# Patient Record
Sex: Female | Born: 1985 | Hispanic: No | Marital: Married | Smoking: Never smoker
Health system: Southern US, Community
[De-identification: ages and names within clinical notes are randomized; demographics above are authoritative.]

## PROBLEM LIST (undated history)

## (undated) DIAGNOSIS — Z8759 Personal history of other complications of pregnancy, childbirth and the puerperium: Secondary | ICD-10-CM

## (undated) HISTORY — PX: NO PAST SURGERIES: SHX2092

---

## 2010-08-09 NOTE — L&D Delivery Note (Signed)
Delivery Note  At 6:20 AM a non-viable female (intrauterine fetal demise) was delivered via Vaginal, Spontaneous Delivery (Presentation: delivered with nursing). APGAR: n/a; weight 1 lb 12 oz (794 g).  Placenta status: Intact, Spontaneous, no obvious abnormalities, to pathology. Cord: 3 vessels with the following complications: None.   Anesthesia: None  Episiotomy: None  Lacerations: None  Est. Blood Loss (mL): <500   Mom to women's unit. Fetus to autopsy.   Supervised by Greig Right, CNM   [name] [name]  08/06/2011, 6:40 AM

## 2010-08-09 NOTE — L&D Delivery Note (Deleted)
Delivery Note At 6:20 AM a non-viable female was delivered via Vaginal, Spontaneous Delivery (Presentation: delivered with nursing).  APGAR: n/a; weight 1 lb 12 oz (794 g).   Placenta status: Intact, Spontaneous.  Cord: 3 vessels with the following complications: None.   Anesthesia: None  Episiotomy: None Lacerations: None Est. Blood Loss (mL): <500  Mom to women's unit.  Baby to nursery-stable.  Supervised by Greig Right, CNM  HUNTER, STEPHEN 08/06/2011, 6:40 AM

## 2010-09-28 ENCOUNTER — Other Ambulatory Visit: Payer: Self-pay | Admitting: Family Medicine

## 2010-09-28 ENCOUNTER — Other Ambulatory Visit: Payer: Self-pay | Admitting: Obstetrics & Gynecology

## 2010-09-28 DIAGNOSIS — Z3689 Encounter for other specified antenatal screening: Secondary | ICD-10-CM

## 2010-09-28 DIAGNOSIS — Z3687 Encounter for antenatal screening for uncertain dates: Secondary | ICD-10-CM

## 2010-09-28 LAB — HEMOGLOBIN EVAL RFX ELECTROPHORESIS: Hemoglobin Evaluation: NORMAL

## 2010-10-02 ENCOUNTER — Ambulatory Visit (HOSPITAL_COMMUNITY)
Admission: RE | Admit: 2010-10-02 | Discharge: 2010-10-02 | Disposition: A | Discharge status: Home / Self Care | Payer: Medicaid Other | Source: Ambulatory Visit | Attending: Family Medicine | Admitting: Family Medicine

## 2010-10-02 DIAGNOSIS — Z363 Encounter for antenatal screening for malformations: Secondary | ICD-10-CM | POA: Insufficient documentation

## 2010-10-02 DIAGNOSIS — Z3689 Encounter for other specified antenatal screening: Secondary | ICD-10-CM

## 2010-10-02 DIAGNOSIS — Z3687 Encounter for antenatal screening for uncertain dates: Secondary | ICD-10-CM

## 2010-10-02 DIAGNOSIS — O358XX0 Maternal care for other (suspected) fetal abnormality and damage, not applicable or unspecified: Secondary | ICD-10-CM | POA: Insufficient documentation

## 2010-10-02 DIAGNOSIS — Z1389 Encounter for screening for other disorder: Secondary | ICD-10-CM | POA: Insufficient documentation

## 2010-10-23 ENCOUNTER — Other Ambulatory Visit: Payer: Self-pay | Admitting: Family Medicine

## 2010-10-23 DIAGNOSIS — N1330 Unspecified hydronephrosis: Secondary | ICD-10-CM

## 2010-11-01 ENCOUNTER — Inpatient Hospital Stay (HOSPITAL_COMMUNITY): Payer: Medicaid Other

## 2010-11-01 ENCOUNTER — Emergency Department (HOSPITAL_COMMUNITY)
Admission: EM | Admit: 2010-11-01 | Discharge: 2010-11-01 | Disposition: A | Discharge status: Other Acute Inpatient Hospital | Payer: Medicaid Other | Source: Home / Self Care | Attending: Emergency Medicine | Admitting: Emergency Medicine

## 2010-11-01 ENCOUNTER — Inpatient Hospital Stay (HOSPITAL_COMMUNITY)
Admission: AD | Admit: 2010-11-01 | Discharge: 2010-11-03 | DRG: 778 | Disposition: A | Discharge status: Home / Self Care | Payer: Medicaid Other | Source: Ambulatory Visit | Attending: Obstetrics & Gynecology | Admitting: Obstetrics & Gynecology

## 2010-11-01 DIAGNOSIS — O4700 False labor before 37 completed weeks of gestation, unspecified trimester: Principal | ICD-10-CM | POA: Diagnosis present

## 2010-11-01 DIAGNOSIS — O4690 Antepartum hemorrhage, unspecified, unspecified trimester: Secondary | ICD-10-CM | POA: Diagnosis present

## 2010-11-01 LAB — DIFFERENTIAL
Basophils Relative: 0 % (ref 0–1)
Eosinophils Absolute: 0.2 10*3/uL (ref 0.0–0.7)
Lymphs Abs: 1.2 10*3/uL (ref 0.7–4.0)
Monocytes Relative: 10 % (ref 3–12)
Neutro Abs: 6.5 10*3/uL (ref 1.7–7.7)
Neutrophils Relative %: 74 % (ref 43–77)

## 2010-11-01 LAB — URINE MICROSCOPIC-ADD ON

## 2010-11-01 LAB — URINALYSIS, ROUTINE W REFLEX MICROSCOPIC
Protein, ur: NEGATIVE mg/dL
Urobilinogen, UA: 0.2 mg/dL (ref 0.0–1.0)

## 2010-11-01 LAB — BASIC METABOLIC PANEL
CO2: 20 mEq/L (ref 19–32)
Chloride: 105 mEq/L (ref 96–112)
GFR calc Af Amer: >60 mL/min (ref >60)
Potassium: 3.4 mEq/L — ABNORMAL LOW (ref 3.5–5.1)

## 2010-11-01 LAB — RAPID URINE DRUG SCREEN, HOSP PERFORMED
Amphetamines: NOT DETECTED
Cocaine: NOT DETECTED
Opiates: NOT DETECTED
Tetrahydrocannabinol: NOT DETECTED

## 2010-11-01 LAB — CBC
Hemoglobin: 10.8 g/dL — ABNORMAL LOW (ref 12.0–15.0)
Platelets: 222 10*3/uL (ref 150–400)
RBC: 4.28 MIL/uL (ref 3.87–5.11)
WBC: 8.8 10*3/uL (ref 4.0–10.5)

## 2010-11-01 LAB — TYPE AND SCREEN: Antibody Screen: NEGATIVE

## 2010-11-01 LAB — ABO/RH: ABO/RH(D): O POS

## 2010-11-02 LAB — KLEIHAUER-BETKE STAIN: Quantitation Fetal Hemoglobin: 0 mL

## 2010-11-03 LAB — STREP B DNA PROBE: Strep Group B Ag: POSITIVE

## 2010-11-05 ENCOUNTER — Other Ambulatory Visit: Payer: Self-pay | Admitting: Family Medicine

## 2010-11-05 ENCOUNTER — Inpatient Hospital Stay (HOSPITAL_COMMUNITY)
Admission: AD | Admit: 2010-11-05 | Discharge: 2010-11-07 | DRG: 774 | Disposition: A | Discharge status: Home Health | Payer: Medicaid Other | Source: Ambulatory Visit | Attending: Obstetrics & Gynecology | Admitting: Obstetrics & Gynecology

## 2010-11-05 DIAGNOSIS — O99892 Other specified diseases and conditions complicating childbirth: Secondary | ICD-10-CM

## 2010-11-05 DIAGNOSIS — Z22330 Carrier of Group B streptococcus: Secondary | ICD-10-CM

## 2010-11-05 DIAGNOSIS — Z331 Pregnant state, incidental: Secondary | ICD-10-CM

## 2010-11-05 DIAGNOSIS — O9989 Other specified diseases and conditions complicating pregnancy, childbirth and the puerperium: Secondary | ICD-10-CM

## 2010-11-05 DIAGNOSIS — O26879 Cervical shortening, unspecified trimester: Secondary | ICD-10-CM

## 2010-11-05 LAB — CBC
HCT: 36.1 % (ref 36.0–46.0)
MCH: 25.3 pg — ABNORMAL LOW (ref 26.0–34.0)
MCHC: 31.9 g/dL (ref 30.0–36.0)
MCV: 79.3 fL (ref 78.0–100.0)
Platelets: 200 10*3/uL (ref 150–400)
RDW: 16.3 % — ABNORMAL HIGH (ref 11.5–15.5)

## 2010-11-05 LAB — POCT URINALYSIS DIP (DEVICE)
Ketones, ur: NEGATIVE mg/dL
Protein, ur: NEGATIVE mg/dL
Specific Gravity, Urine: 1.01 (ref 1.005–1.030)
Urobilinogen, UA: 0.2 mg/dL (ref 0.0–1.0)
pH: 6 (ref 5.0–8.0)

## 2010-11-16 NOTE — Discharge Summary (Addendum)
  NAME:  [name], [name]                     ACCOUNT NO.:  [id number]  MEDICAL RECORD NO.:  [id number]           PATIENT TYPE:  I  LOCATION:  9158                          FACILITY:  [facility]  PHYSICIAN:  Horton Chin, MD DATE OF BIRTH:  08/29/1985  DATE OF ADMISSION:  11/01/2010 DATE OF DISCHARGE:  11/03/2010                              DISCHARGE SUMMARY   ADMISSION DIAGNOSES: 1. Intrauterine pregnancy at 30 weeks and 3 days. 2. Vaginal bleeding.  DISCHARGE DIAGNOSES: 1. Intrauterine pregnancy at 30 weeks and 3 days. 2. Vaginal bleeding.  ATTENDING:  Horton Chin, MD  FELLOW:  Maryelizabeth Kaufmann, MD  DISCHARGE MEDICATIONS:  Prometrium 200 mg 1 tablet per vagina nightly for shortened cervix.  PERTINENT LABS:  Ultrasound was negative for abruption.  Cervical length 1.3 cm, vertex, and normal AFI.  HOSPITAL COURSE:  This is a 25 year old gravida 1 with intrauterine pregnancy at 30 weeks and 3 days who presented on the day of admission who was complaining of vaginal bleeding.  The patient had an ultrasound that was negative for abruption.  Her cervix on admission fingertip, cervix was closed, and there was small amount of blood on physical exam. The patient was admitted for observation.  The patient was otherwise stable.  She has not had any bleeding since admission.  She was started on magnesium for 12 hours CP prophylaxis.  She was also started on Procardia for additional tocolysis while obtaining her betamethasone for 2 days.  The patient did receive her betamethasone.  Her Kleihauer-Betke test was also negative.  Fetal heart tracings remained reassuring. Findings were discussed with the patient and she agreed with discharge.  DISPOSITION:  Discharged home.  DISCHARGE CONDITION:  Stable.  FOLLOWUP:  The patient is to [facility] for [facility] care and evaluation of her vaginal bleeding, probably within the next week, preferably on Thursday,  however.  ER WARNINGS:  Any fever, chills, nausea, vomiting, bleeding, spotting, cramping, decreased fetal movement, or rupture of membranes or any other concerning symptoms.    ______________________________ Maryelizabeth Kaufmann, MD   ______________________________ Horton Chin, MD    LC/MEDQ  D:  11/03/2010  T:  11/04/2010  Job:  [id number]  Electronically Signed by Jaynie Collins MD on 11/16/2010 11:53:52 AM Electronically Signed by Maryelizabeth Kaufmann MD on 11/16/2010 12:27:08 PM

## 2010-11-27 ENCOUNTER — Ambulatory Visit (HOSPITAL_COMMUNITY): Payer: Medicaid Other

## 2011-05-25 ENCOUNTER — Other Ambulatory Visit (HOSPITAL_COMMUNITY): Payer: Self-pay | Admitting: Physician Assistant

## 2011-05-25 LAB — CBC
HCT: 42 % (ref 36–46)
HCT: 42 % (ref 36–46)
Platelets: 215 10*3/uL (ref 150–399)
Platelets: 215 10*3/uL (ref 150–399)

## 2011-05-25 LAB — ABO/RH

## 2011-05-25 LAB — GC/CHLAMYDIA PROBE AMP, GENITAL: Gonorrhea: NEGATIVE

## 2011-05-25 LAB — RPR: RPR: NONREACTIVE

## 2011-05-26 LAB — RUBELLA ANTIBODY, IGM: Rubella: IMMUNE

## 2011-05-27 ENCOUNTER — Ambulatory Visit (HOSPITAL_COMMUNITY)
Admission: RE | Admit: 2011-05-27 | Discharge: 2011-05-27 | Disposition: A | Discharge status: Home / Self Care | Payer: Medicaid Other | Source: Ambulatory Visit | Attending: Physician Assistant | Admitting: Physician Assistant

## 2011-05-27 ENCOUNTER — Other Ambulatory Visit (HOSPITAL_COMMUNITY): Payer: Self-pay | Admitting: Physician Assistant

## 2011-05-27 DIAGNOSIS — Z3689 Encounter for other specified antenatal screening: Secondary | ICD-10-CM | POA: Insufficient documentation

## 2011-05-31 ENCOUNTER — Encounter: Payer: Self-pay | Admitting: Obstetrics and Gynecology

## 2011-05-31 ENCOUNTER — Other Ambulatory Visit (HOSPITAL_COMMUNITY): Payer: Self-pay | Admitting: Diagnostic Radiology

## 2011-06-03 ENCOUNTER — Other Ambulatory Visit: Payer: Self-pay | Admitting: Obstetrics & Gynecology

## 2011-06-03 ENCOUNTER — Ambulatory Visit (INDEPENDENT_AMBULATORY_CARE_PROVIDER_SITE_OTHER): Payer: Medicaid Other | Admitting: Obstetrics & Gynecology

## 2011-06-03 VITALS — Temp 96.6°F | Ht 62.25 in | Wt 121.2 lb

## 2011-06-03 DIAGNOSIS — O0990 Supervision of high risk pregnancy, unspecified, unspecified trimester: Secondary | ICD-10-CM

## 2011-06-03 DIAGNOSIS — O09219 Supervision of pregnancy with history of pre-term labor, unspecified trimester: Secondary | ICD-10-CM

## 2011-06-03 DIAGNOSIS — O358XX0 Maternal care for other (suspected) fetal abnormality and damage, not applicable or unspecified: Secondary | ICD-10-CM

## 2011-06-03 LAB — POCT URINALYSIS DIP (DEVICE)
Bilirubin Urine: NEGATIVE
Glucose, UA: NEGATIVE mg/dL
Ketones, ur: NEGATIVE mg/dL
Nitrite: NEGATIVE
pH: 7 (ref 5.0–8.0)

## 2011-06-03 NOTE — Progress Notes (Signed)
Addended by: Sherre Lain A on: 06/03/2011 10:56 AM   Modules accepted: Orders

## 2011-06-03 NOTE — Progress Notes (Signed)
U/S scheduled 06/24/11 at 245pm.

## 2011-06-03 NOTE — Progress Notes (Signed)
H/O PTB at 31 weeks, s/p abruption 10/2010  Rec 17P starting next week 16 weeks. Discussed via interpreter Repeat Quad screen today

## 2011-06-03 NOTE — Progress Notes (Signed)
Pt had flu vaccine at health dept.  Used int from [employer] note from HD stating that patient needs Quad screen redrawn, it was drawn too early at HD.

## 2011-06-03 NOTE — Progress Notes (Signed)
Nutrition Note:  Referred for 1st consult, receives [facility] services. Pt reports intake of 2-3 meals, no food allergies.  Current wt gain of 1# is inadequate for [redacted]w[redacted]d gestation. Pt reports vomiting 1-2 x daily. Plans to increase overall intake to include 4-5 smaller meals or 2-3 meals and 2 snacks.  Pt reports taking PNV.   Follow up in 4-6 weeks Cy Blamer, RD

## 2011-06-10 ENCOUNTER — Ambulatory Visit (INDEPENDENT_AMBULATORY_CARE_PROVIDER_SITE_OTHER): Payer: Medicaid Other | Admitting: Obstetrics & Gynecology

## 2011-06-10 ENCOUNTER — Other Ambulatory Visit: Payer: Self-pay | Admitting: Obstetrics & Gynecology

## 2011-06-10 ENCOUNTER — Encounter: Payer: Self-pay | Admitting: Obstetrics & Gynecology

## 2011-06-10 VITALS — BP 104/68 | Temp 98.2°F | Wt 121.5 lb

## 2011-06-10 DIAGNOSIS — Z3480 Encounter for supervision of other normal pregnancy, unspecified trimester: Secondary | ICD-10-CM

## 2011-06-10 DIAGNOSIS — Z23 Encounter for immunization: Secondary | ICD-10-CM

## 2011-06-10 DIAGNOSIS — Z3490 Encounter for supervision of normal pregnancy, unspecified, unspecified trimester: Secondary | ICD-10-CM

## 2011-06-10 LAB — POCT URINALYSIS DIP (DEVICE)
Glucose, UA: NEGATIVE mg/dL
Leukocytes, UA: NEGATIVE
Nitrite: NEGATIVE
Urobilinogen, UA: 0.2 mg/dL (ref 0.0–1.0)
pH: 6.5 (ref 5.0–8.0)

## 2011-06-10 MED ORDER — HYDROXYPROGESTERONE CAPROATE 250 MG/ML IM OIL: 250.0000 mg | TOPICAL_OIL | INTRAMUSCULAR | Status: DC

## 2011-06-10 MED ORDER — TETANUS-DIPHTH-ACELL PERTUSSIS 5-2.5-18.5 LF-MCG/0.5 IM SUSP
0.5000 mL | Freq: Once | INTRAMUSCULAR | Status: AC
  Administered 2011-06-10: 0.5 mL via INTRAMUSCULAR

## 2011-06-10 MED ORDER — HYDROXYPROGESTERONE CAPROATE 250 MG/ML IM OIL
250.0000 mg | TOPICAL_OIL | INTRAMUSCULAR | Status: DC
  Administered 2011-06-10 – 2011-07-29 (×8): 250 mg via INTRAMUSCULAR

## 2011-06-10 NOTE — Progress Notes (Signed)
U/S appt. changed to 3pm on Nov. 15, 2012. Patient needs to schedule medicaid transportation for this appt.aAfter they have received confirmation that patient kept her appt. here on 06/17/11.

## 2011-06-10 NOTE — Progress Notes (Signed)
Called patient with [name] [name] Interpreter [phone] - notified patient she left before we completed all needed lab/shots. Requested patient come back today before 4pm so we can complete labs/shot.- patient states she can come back today

## 2011-06-10 NOTE — Progress Notes (Signed)
Pulse: 72

## 2011-06-10 NOTE — Progress Notes (Signed)
History of 31 week delivery earlier this year.  Will offer 17-P--Pt agrees.  Due date is by 14 week [place].  Pt wants quad screen.  Needs Medicaid transportation for weekly visits.

## 2011-06-17 ENCOUNTER — Ambulatory Visit (INDEPENDENT_AMBULATORY_CARE_PROVIDER_SITE_OTHER): Payer: Medicaid Other | Admitting: Obstetrics & Gynecology

## 2011-06-17 DIAGNOSIS — O0990 Supervision of high risk pregnancy, unspecified, unspecified trimester: Secondary | ICD-10-CM

## 2011-06-17 DIAGNOSIS — O09219 Supervision of pregnancy with history of pre-term labor, unspecified trimester: Secondary | ICD-10-CM

## 2011-06-17 LAB — POCT URINALYSIS DIP (DEVICE)
Ketones, ur: NEGATIVE mg/dL
Protein, ur: NEGATIVE mg/dL
Specific Gravity, Urine: 1.015 (ref 1.005–1.030)

## 2011-06-17 NOTE — Progress Notes (Signed)
17 p  Today, [place] next week

## 2011-06-17 NOTE — Progress Notes (Signed)
Pulse- 76.  Pt has c/o headache and dizziness.

## 2011-06-24 ENCOUNTER — Ambulatory Visit (HOSPITAL_COMMUNITY): Payer: Medicaid Other

## 2011-06-24 ENCOUNTER — Ambulatory Visit (INDEPENDENT_AMBULATORY_CARE_PROVIDER_SITE_OTHER): Payer: Medicaid Other | Admitting: *Deleted

## 2011-06-24 ENCOUNTER — Ambulatory Visit (HOSPITAL_COMMUNITY)
Admission: RE | Admit: 2011-06-24 | Discharge: 2011-06-24 | Disposition: A | Discharge status: Home / Self Care | Payer: Medicaid Other | Source: Ambulatory Visit | Attending: Obstetrics & Gynecology | Admitting: Obstetrics & Gynecology

## 2011-06-24 VITALS — BP 103/73 | HR 117 | Temp 97.4°F

## 2011-06-24 DIAGNOSIS — Z3490 Encounter for supervision of normal pregnancy, unspecified, unspecified trimester: Secondary | ICD-10-CM

## 2011-06-24 DIAGNOSIS — O09219 Supervision of pregnancy with history of pre-term labor, unspecified trimester: Secondary | ICD-10-CM

## 2011-06-24 DIAGNOSIS — Z8751 Personal history of pre-term labor: Secondary | ICD-10-CM

## 2011-06-24 DIAGNOSIS — Z1389 Encounter for screening for other disorder: Secondary | ICD-10-CM | POA: Insufficient documentation

## 2011-06-24 DIAGNOSIS — Z363 Encounter for antenatal screening for malformations: Secondary | ICD-10-CM | POA: Insufficient documentation

## 2011-06-24 DIAGNOSIS — O358XX0 Maternal care for other (suspected) fetal abnormality and damage, not applicable or unspecified: Secondary | ICD-10-CM | POA: Insufficient documentation

## 2011-06-24 NOTE — Progress Notes (Signed)
Pt in for 17 P only. Baby is moving well. Pt is feeling well.

## 2011-06-28 ENCOUNTER — Other Ambulatory Visit: Payer: Self-pay | Admitting: Obstetrics & Gynecology

## 2011-06-28 NOTE — Progress Notes (Signed)
Called [facility]FM to schedule appointment for 07/21/11  At 0930 for FU Ultrasound as ordered by Dr. Penne Lash to fu/ tachycardia

## 2011-06-28 NOTE — Progress Notes (Signed)
Called MFM to schedule FU Ultrasound for 07/21/11 at 0930 when pt is 22 weeks per order

## 2011-06-30 ENCOUNTER — Ambulatory Visit (INDEPENDENT_AMBULATORY_CARE_PROVIDER_SITE_OTHER): Payer: Medicaid Other | Admitting: *Deleted

## 2011-06-30 DIAGNOSIS — O09219 Supervision of pregnancy with history of pre-term labor, unspecified trimester: Secondary | ICD-10-CM

## 2011-07-08 ENCOUNTER — Ambulatory Visit (INDEPENDENT_AMBULATORY_CARE_PROVIDER_SITE_OTHER): Payer: Medicaid Other | Admitting: Obstetrics & Gynecology

## 2011-07-08 ENCOUNTER — Other Ambulatory Visit: Payer: Self-pay | Admitting: Obstetrics and Gynecology

## 2011-07-08 DIAGNOSIS — O4440 Low lying placenta NOS or without hemorrhage, unspecified trimester: Secondary | ICD-10-CM

## 2011-07-08 DIAGNOSIS — O359XX0 Maternal care for (suspected) fetal abnormality and damage, unspecified, not applicable or unspecified: Secondary | ICD-10-CM

## 2011-07-08 DIAGNOSIS — O4410 Placenta previa with hemorrhage, unspecified trimester: Secondary | ICD-10-CM

## 2011-07-08 DIAGNOSIS — O358XX0 Maternal care for other (suspected) fetal abnormality and damage, not applicable or unspecified: Secondary | ICD-10-CM

## 2011-07-08 DIAGNOSIS — O0990 Supervision of high risk pregnancy, unspecified, unspecified trimester: Secondary | ICD-10-CM

## 2011-07-08 DIAGNOSIS — O09219 Supervision of pregnancy with history of pre-term labor, unspecified trimester: Secondary | ICD-10-CM | POA: Insufficient documentation

## 2011-07-08 LAB — POCT URINALYSIS DIP (DEVICE)
Glucose, UA: NEGATIVE mg/dL
Hgb urine dipstick: NEGATIVE
Nitrite: NEGATIVE
Specific Gravity, Urine: 1.025 (ref 1.005–1.030)

## 2011-07-08 NOTE — Progress Notes (Signed)
Pt reports that her urine is red in color, has no discomfort. Needs a refill on her PNV.  Used interpreter.

## 2011-07-08 NOTE — Progress Notes (Signed)
Rn states urine not red in color.  Sterile speculum exam reveals no blood.  Pt does have a hemorrhoid. [place] at MFM at 22 weeks to follow up fetal tachycarida. 17-P today.

## 2011-07-08 NOTE — Progress Notes (Signed)
Quad screen today No blood in UA,

## 2011-07-08 NOTE — Patient Instructions (Signed)
Breastfeeding BENEFITS OF BREASTFEEDING For the baby  The first milk (colostrum) helps the baby's digestive system function better.   There are antibodies from the mother in the milk that help the baby fight off infections.   The baby has a lower incidence of asthma, allergies, and SIDS (sudden infant death syndrome).   The nutrients in breast milk are better than formulas for the baby and helps the baby's brain grow better.   Babies who breastfeed have less gas, colic, and constipation.  For the mother  Breastfeeding helps develop a very special bond between mother and baby.   It is more convenient, always available at the correct temperature and cheaper than formula feeding.   It burns calories in the mother and helps with losing weight that was gained during pregnancy.   It makes the uterus contract back down to normal size faster and slows bleeding following delivery.   Breastfeeding mothers have a lower risk of developing breast cancer.  NURSE FREQUENTLY  A healthy, full-term baby may breastfeed as often as every hour or space his or her feedings to every 3 hours.   How often to nurse will vary from baby to baby. Watch your baby for signs of hunger, not the clock.   Nurse as often as the baby requests, or when you feel the need to reduce the fullness of your breasts.   Awaken the baby if it has been 3 to 4 hours since the last feeding.   Frequent feeding will help the mother make more milk and will prevent problems like sore nipples and engorgement of the breasts.  BABY'S POSITION AT THE BREAST  Whether lying down or sitting, be sure that the baby's tummy is facing your tummy.   Support the breast with 4 fingers underneath the breast and the thumb above. Make sure your fingers are well away from the nipple and baby's mouth.   Stroke the baby's lips and cheek closest to the breast gently with your finger or nipple.   When the baby's mouth is open wide enough, place  all of your nipple and as much of the dark area around the nipple as possible into your baby's mouth.   Pull the baby in close so the tip of the nose and the baby's cheeks touch the breast during the feeding.  FEEDINGS  The length of each feeding varies from baby to baby and from feeding to feeding.   The baby must suck about 2 to 3 minutes for your milk to get to him or her. This is called a "let down." For this reason, allow the baby to feed on each breast as long as he or she wants. Your baby will end the feeding when he or she has received the right balance of nutrients.   To break the suction, put your finger into the corner of the baby's mouth and slide it between his or her gums before removing your breast from his or her mouth. This will help prevent sore nipples.  REDUCING BREAST ENGORGEMENT  In the first week after your baby is born, you may experience signs of breast engorgement. When breasts are engorged, they feel heavy, warm, full, and may be tender to the touch. You can reduce engorgement if you:   Nurse frequently, every 2 to 3 hours. Mothers who breastfeed early and often have fewer problems with engorgement.   Place light ice packs on your breasts between feedings. This reduces swelling. Wrap the ice packs in a   lightweight towel to protect your skin.   Apply moist hot packs to your breast for 5 to 10 minutes before each feeding. This increases circulation and helps the milk flow.   Gently massage your breast before and during the feeding.   Make sure that the baby empties at least one breast at every feeding before switching sides.   Use a breast pump to empty the breasts if your baby is sleepy or not nursing well. You may also want to pump if you are returning to work or or you feel you are getting engorged.   Avoid bottle feeds, pacifiers or supplemental feedings of water or juice in place of breastfeeding.   Be sure the baby is latched on and positioned properly while  breastfeeding.   Prevent fatigue, stress, and anemia.   Wear a supportive bra, avoiding underwire styles.   Eat a balanced diet with enough fluids.  If you follow these suggestions, your engorgement should improve in 24 to 48 hours. If you are still experiencing difficulty, call your lactation consultant or caregiver. IS MY BABY GETTING ENOUGH MILK? Sometimes, mothers worry about whether their babies are getting enough milk. You can be assured that your baby is getting enough milk if:  The baby is actively sucking and you hear swallowing.   The baby nurses at least 8 to 12 times in a 24 hour time period. Nurse your baby until he or she unlatches or falls asleep at the first breast (at least 10 to 20 minutes), then offer the second side.   The baby is wetting 5 to 6 disposable diapers (6 to 8 cloth diapers) in a 24 hour period by 5 to 6 days of age.   The baby is having at least 2 to 3 stools every 24 hours for the first few months. Breast milk is all the food your baby needs. It is not necessary for your baby to have water or formula. In fact, to help your breasts make more milk, it is best not to give your baby supplemental feedings during the early weeks.   The stool should be soft and yellow.   The baby should gain 4 to 7 ounces per week after he is 4 days old.  TAKE CARE OF YOURSELF Take care of your breasts by:  Bathing or showering daily.   Avoiding the use of soaps on your nipples.   Start feedings on your left breast at one feeding and on your right breast at the next feeding.   You will notice an increase in your milk supply 2 to 5 days after delivery. You may feel some discomfort from engorgement, which makes your breasts very firm and often tender. Engorgement "peaks" out within 24 to 48 hours. In the meantime, apply warm moist towels to your breasts for 5 to 10 minutes before feeding. Gentle massage and expression of some milk before feeding will soften your breasts, making  it easier for your baby to latch on. Wear a well fitting nursing bra and air dry your nipples for 10 to 15 minutes after each feeding.   Only use cotton bra pads.   Only use pure lanolin on your nipples after nursing. You do not need to wash it off before nursing.  Take care of yourself by:   Eating well-balanced meals and nutritious snacks.   Drinking milk, fruit juice, and water to satisfy your thirst (about 8 glasses a day).   Getting plenty of rest.   Increasing calcium in   your diet (1200 mg a day).   Avoiding foods that you notice affect the baby in a bad way.  SEEK MEDICAL CARE IF:   You have any questions or difficulty with breastfeeding.   You need help.   You have a hard, red, sore area on your breast, accompanied by a fever of 100.5 F (38.1 C) or more.   Your baby is too sleepy to eat well or is having trouble sleeping.   Your baby is wetting less than 6 diapers per day, by 5 days of age.   Your baby's skin or white part of his or her eyes is more yellow than it was in the hospital.   You feel depressed.  Document Released: 07/26/2005 Document Revised: 04/07/2011 Document Reviewed: 03/10/2009 [employer] Patient Information 2012 [employer]. 

## 2011-07-12 ENCOUNTER — Encounter: Payer: Self-pay | Admitting: *Deleted

## 2011-07-15 ENCOUNTER — Ambulatory Visit (INDEPENDENT_AMBULATORY_CARE_PROVIDER_SITE_OTHER): Payer: Medicaid Other

## 2011-07-15 DIAGNOSIS — O09219 Supervision of pregnancy with history of pre-term labor, unspecified trimester: Secondary | ICD-10-CM

## 2011-07-19 ENCOUNTER — Ambulatory Visit (HOSPITAL_COMMUNITY)
Admission: RE | Admit: 2011-07-19 | Discharge: 2011-07-19 | Disposition: A | Discharge status: Home / Self Care | Payer: Medicaid Other | Source: Ambulatory Visit | Attending: Obstetrics & Gynecology | Admitting: Obstetrics & Gynecology

## 2011-07-19 DIAGNOSIS — O4400 Placenta previa specified as without hemorrhage, unspecified trimester: Secondary | ICD-10-CM | POA: Insufficient documentation

## 2011-07-19 DIAGNOSIS — O0990 Supervision of high risk pregnancy, unspecified, unspecified trimester: Secondary | ICD-10-CM

## 2011-07-19 DIAGNOSIS — O368390 Maternal care for abnormalities of the fetal heart rate or rhythm, unspecified trimester, not applicable or unspecified: Secondary | ICD-10-CM | POA: Insufficient documentation

## 2011-07-19 DIAGNOSIS — Z8751 Personal history of pre-term labor: Secondary | ICD-10-CM | POA: Insufficient documentation

## 2011-07-19 NOTE — Progress Notes (Signed)
Obstetric ultrasound completed today.  Please see report in [facility]. 

## 2011-07-19 NOTE — Progress Notes (Signed)
Obstetric ultrasound performed today.  Please see report in [facility]. 

## 2011-07-22 ENCOUNTER — Other Ambulatory Visit: Payer: Self-pay | Admitting: Obstetrics & Gynecology

## 2011-07-22 ENCOUNTER — Ambulatory Visit (INDEPENDENT_AMBULATORY_CARE_PROVIDER_SITE_OTHER): Payer: Medicaid Other | Admitting: Obstetrics & Gynecology

## 2011-07-22 DIAGNOSIS — O09219 Supervision of pregnancy with history of pre-term labor, unspecified trimester: Secondary | ICD-10-CM

## 2011-07-22 DIAGNOSIS — O0990 Supervision of high risk pregnancy, unspecified, unspecified trimester: Secondary | ICD-10-CM

## 2011-07-22 DIAGNOSIS — O4410 Placenta previa with hemorrhage, unspecified trimester: Secondary | ICD-10-CM

## 2011-07-22 DIAGNOSIS — O4440 Low lying placenta NOS or without hemorrhage, unspecified trimester: Secondary | ICD-10-CM

## 2011-07-22 LAB — POCT URINALYSIS DIP (DEVICE)
Bilirubin Urine: NEGATIVE
Hgb urine dipstick: NEGATIVE
Ketones, ur: NEGATIVE mg/dL
Leukocytes, UA: NEGATIVE
pH: 7 (ref 5.0–8.0)

## 2011-07-22 NOTE — Progress Notes (Signed)
No arrythmia seen on 12/11 scan, normal anatomy, resolved low lying placenta.  No other complaints or concerns.  Fetal movement and labor precautions reviewed. Continue weekly 17 P.

## 2011-07-22 NOTE — Progress Notes (Signed)
Pt states every am get pain in lower abdomen and also pain in back. Pulse 107.  No vaginal discharge. Used interpreter [employer].

## 2011-07-29 ENCOUNTER — Ambulatory Visit (INDEPENDENT_AMBULATORY_CARE_PROVIDER_SITE_OTHER): Payer: Medicaid Other | Admitting: *Deleted

## 2011-07-29 DIAGNOSIS — O0990 Supervision of high risk pregnancy, unspecified, unspecified trimester: Secondary | ICD-10-CM

## 2011-07-29 DIAGNOSIS — O09219 Supervision of pregnancy with history of pre-term labor, unspecified trimester: Secondary | ICD-10-CM

## 2011-08-05 ENCOUNTER — Inpatient Hospital Stay (HOSPITAL_COMMUNITY)
Admission: AD | Admit: 2011-08-05 | Discharge: 2011-08-07 | DRG: 775 | Disposition: A | Discharge status: Home / Self Care | Payer: Medicaid Other | Source: Ambulatory Visit | Attending: Obstetrics & Gynecology | Admitting: Obstetrics & Gynecology

## 2011-08-05 ENCOUNTER — Other Ambulatory Visit: Payer: Self-pay

## 2011-08-05 ENCOUNTER — Ambulatory Visit (INDEPENDENT_AMBULATORY_CARE_PROVIDER_SITE_OTHER): Payer: Medicaid Other | Admitting: Obstetrics & Gynecology

## 2011-08-05 ENCOUNTER — Encounter: Payer: Self-pay | Admitting: Obstetrics & Gynecology

## 2011-08-05 ENCOUNTER — Encounter (HOSPITAL_COMMUNITY): Payer: Self-pay | Admitting: *Deleted

## 2011-08-05 DIAGNOSIS — O364XX0 Maternal care for intrauterine death, not applicable or unspecified: Principal | ICD-10-CM | POA: Diagnosis present

## 2011-08-05 DIAGNOSIS — O321XX0 Maternal care for breech presentation, not applicable or unspecified: Secondary | ICD-10-CM | POA: Diagnosis present

## 2011-08-05 DIAGNOSIS — Z8759 Personal history of other complications of pregnancy, childbirth and the puerperium: Secondary | ICD-10-CM

## 2011-08-05 DIAGNOSIS — O09219 Supervision of pregnancy with history of pre-term labor, unspecified trimester: Secondary | ICD-10-CM

## 2011-08-05 DIAGNOSIS — Z22330 Carrier of Group B streptococcus: Secondary | ICD-10-CM

## 2011-08-05 DIAGNOSIS — O99892 Other specified diseases and conditions complicating childbirth: Secondary | ICD-10-CM | POA: Diagnosis present

## 2011-08-05 DIAGNOSIS — O411090 Infection of amniotic sac and membranes, unspecified, unspecified trimester, not applicable or unspecified: Secondary | ICD-10-CM | POA: Diagnosis present

## 2011-08-05 DIAGNOSIS — O0990 Supervision of high risk pregnancy, unspecified, unspecified trimester: Secondary | ICD-10-CM

## 2011-08-05 HISTORY — DX: Personal history of other complications of pregnancy, childbirth and the puerperium: Z87.59

## 2011-08-05 LAB — TSH: TSH: 1.102 u[IU]/mL (ref 0.350–4.500)

## 2011-08-05 LAB — RPR: RPR Ser Ql: NONREACTIVE

## 2011-08-05 LAB — CBC
HCT: 34.4 % — ABNORMAL LOW (ref 36.0–46.0)
MCHC: 32.8 g/dL (ref 30.0–36.0)
Platelets: 223 10*3/uL (ref 150–400)
RDW: 16.9 % — ABNORMAL HIGH (ref 11.5–15.5)
WBC: 6.3 10*3/uL (ref 4.0–10.5)

## 2011-08-05 LAB — COMPREHENSIVE METABOLIC PANEL
AST: 50 U/L — ABNORMAL HIGH (ref 0–37)
CO2: 21 mEq/L (ref 19–32)
Calcium: 8.7 mg/dL (ref 8.4–10.5)
Creatinine, Ser: 0.48 mg/dL — ABNORMAL LOW (ref 0.50–1.10)
GFR calc non Af Amer: >90 mL/min (ref >90)
Total Protein: 7.6 g/dL (ref 6.0–8.3)

## 2011-08-05 LAB — POCT URINALYSIS DIP (DEVICE)
Ketones, ur: NEGATIVE mg/dL
Protein, ur: NEGATIVE mg/dL
Specific Gravity, Urine: 1.01 (ref 1.005–1.030)

## 2011-08-05 MED ORDER — OXYCODONE-ACETAMINOPHEN 5-325 MG PO TABS: 2.0000 | ORAL_TABLET | ORAL | Status: DC | PRN

## 2011-08-05 MED ORDER — IBUPROFEN 600 MG PO TABS
600.0000 mg | ORAL_TABLET | Freq: Four times a day (QID) | ORAL | Status: DC | PRN
  Administered 2011-08-06: 600 mg via ORAL
  Filled 2011-08-05: qty 1

## 2011-08-05 MED ORDER — BUTORPHANOL TARTRATE 2 MG/ML IJ SOLN
2.0000 mg | INTRAMUSCULAR | Status: DC | PRN
  Administered 2011-08-06: 2 mg via INTRAVENOUS
  Filled 2011-08-05 (×2): qty 1

## 2011-08-05 MED ORDER — MISOPROSTOL 200 MCG PO TABS
400.0000 ug | ORAL_TABLET | Freq: Once | ORAL | Status: AC
  Administered 2011-08-05: 400 ug via VAGINAL
  Filled 2011-08-05: qty 2

## 2011-08-05 MED ORDER — FLEET ENEMA 7-19 GM/118ML RE ENEM: 1.0000 | ENEMA | RECTAL | Status: DC | PRN

## 2011-08-05 MED ORDER — GENTAMICIN SULFATE 40 MG/ML IJ SOLN
140.0000 mg | Freq: Three times a day (TID) | INTRAMUSCULAR | Status: DC
  Administered 2011-08-06: 140 mg via INTRAVENOUS
  Filled 2011-08-05 (×3): qty 3.5

## 2011-08-05 MED ORDER — ONDANSETRON HCL 4 MG/2ML IJ SOLN: 4.0000 mg | Freq: Four times a day (QID) | INTRAMUSCULAR | Status: DC | PRN

## 2011-08-05 MED ORDER — LIDOCAINE HCL (PF) 1 % IJ SOLN: 30.0000 mL | INTRAMUSCULAR | Status: DC | PRN

## 2011-08-05 MED ORDER — MISOPROSTOL 100 MCG PO TABS: 100.0000 ug | ORAL_TABLET | Freq: Once | ORAL | Status: DC

## 2011-08-05 MED ORDER — OXYTOCIN BOLUS FROM INFUSION
500.0000 mL | Freq: Once | INTRAVENOUS | Status: DC
  Filled 2011-08-05: qty 500

## 2011-08-05 MED ORDER — LACTATED RINGERS IV SOLN
INTRAVENOUS | Status: DC
  Administered 2011-08-05: 125 mL/h via INTRAVENOUS
  Administered 2011-08-06: 02:00:00 via INTRAVENOUS

## 2011-08-05 MED ORDER — LACTATED RINGERS IV SOLN: 500.0000 mL | INTRAVENOUS | Status: DC | PRN

## 2011-08-05 MED ORDER — CITRIC ACID-SODIUM CITRATE 334-500 MG/5ML PO SOLN: 30.0000 mL | ORAL | Status: DC | PRN

## 2011-08-05 MED ORDER — ACETAMINOPHEN 325 MG PO TABS: 650.0000 mg | ORAL_TABLET | ORAL | Status: DC | PRN

## 2011-08-05 MED ORDER — OXYTOCIN 20 UNITS IN LACTATED RINGERS INFUSION - SIMPLE
125.0000 mL/h | Freq: Once | INTRAVENOUS | Status: DC
  Filled 2011-08-05: qty 1000

## 2011-08-05 MED ORDER — SODIUM CHLORIDE 0.9 % IV SOLN
2.0000 g | Freq: Four times a day (QID) | INTRAVENOUS | Status: DC
  Administered 2011-08-05 – 2011-08-06 (×2): 2 g via INTRAVENOUS
  Filled 2011-08-05 (×4): qty 2000

## 2011-08-05 MED ORDER — MISOPROSTOL 200 MCG PO TABS: 400.0000 ug | ORAL_TABLET | ORAL | Status: DC

## 2011-08-05 MED ORDER — MISOPROSTOL 200 MCG PO TABS
400.0000 ug | ORAL_TABLET | ORAL | Status: DC
  Filled 2011-08-05: qty 2

## 2011-08-05 NOTE — Progress Notes (Signed)
Toco applied due to frequent palpable contractions, 'hurt a lot" per pt.  COntractions q 1-2 minutes, 60-70 seconds.  CX 2/80/-1; cytotec from earlier still in posterior fornix.  2nd cytotec held due to  Tachysystole/progressing/cytotec still in vagina.

## 2011-08-05 NOTE — Progress Notes (Signed)
ANTIBIOTIC CONSULT NOTE - INITIAL  Pharmacy Consult for Gentamicin Indication: Presumed Chorioamnionitis  No Known Allergies  Patient Measurements: Height: 5' 2.25" (158.1 cm) Weight: 126 lb 3.2 oz (57.244 kg) IBW/kg (Calculated) : 50.68  Adjusted Body Weight: 57.2kg  Vital Signs: Temp: 100.5 F (38.1 C) (12/27 2243) Temp src: Oral (12/27 2243) BP: 112/70 mmHg (12/27 2243) Pulse Rate: 113  (12/27 2243)   Labs:  Basename 08/05/11 1655  WBC 6.3  HGB 11.3*  PLT 223  LABCREA --  CREATININE 0.48*   Estimated Creatinine Clearance: 87.5 ml/min (by C-G formula based on Cr of 0.48).   Medical History: Past Medical History  Diagnosis Date  . No pertinent past medical history     Medications:  Ampicillin 2 gram IV q6h Assessment: 25 yo admitted with IUFD at 24+ weeks gestation. Maternal temp recorded at 100.5 orally.  Goal of Therapy:  Gentamicin peaks 6-21mcg/ml and trough < 73mcg/ml  Plan:  1. Gentamicin 140mg  IV q8h. 2. Will continue to follow and draw levels as clinically indicated. Thanks!  Claybon Jabs 08/05/2011,11:15 PM

## 2011-08-05 NOTE — Progress Notes (Signed)
Temp 100.5 orally.  Will start ABX for presumed chorioamnionitis

## 2011-08-05 NOTE — Progress Notes (Signed)
Cytotec inserted in posterior vaginal fornix by RN.  CX

## 2011-08-05 NOTE — Progress Notes (Signed)
Cytotec inserted in posterior vaginal fornix by RN.  CX LTC, breech presenting at -2 station

## 2011-08-05 NOTE — H&P (Signed)
[name] [name] is a 25 y.o. female presenting for IOL. Maternal Medical History:  Reason for admission: Reason for Admission:   nauseaPresented for routine PNV and noted to have no FHT. [place]: demise with ascites  Contractions: Not contracting  Fetal activity: Last perceived fetal movement was within the past 12 hours.    Prenatal complications: Fetal tachycardia at 18 wks, no fetal arrythmia on 22 wk scan. Previa resolved.   Prenatal Complications - Diabetes: none.    OB History    Grav Para Term Preterm Abortions TAB SAB Ect Mult Living   2 1 0 1 0 0 0 0 0 1      Past Medical History  Diagnosis Date  . No pertinent past medical history    Past Surgical History  Procedure Date  . No past surgeries    Family History: family history includes Kidney disease in her father. Social History:  reports that she has never smoked. She has never used smokeless tobacco. She reports that she does not drink alcohol or use illicit drugs.  Review of Systems  Constitutional: Negative for fever, chills, weight loss and malaise/fatigue.  Eyes: Negative for blurred vision.  Respiratory: Negative for cough.   Cardiovascular: Negative for chest pain.  Gastrointestinal: Negative for nausea, vomiting, abdominal pain, diarrhea and constipation.  Genitourinary: Negative for dysuria.  Skin: Negative for rash.  Neurological: Negative for headaches.  Psychiatric/Behavioral: Negative for depression.      Blood pressure 106/71, pulse 113, temperature 98.2 F (36.8 C), temperature source Oral, resp. rate 17, height 5' 2.25" (1.581 m), weight 57.244 kg (126 lb 3.2 oz), last menstrual period 01/15/2011, unknown if currently breastfeeding. Maternal Exam:  Uterine Assessment: none  Abdomen: Estimated fetal weight is 1.5#.   Fetal presentation: breech  Pelvis: adequate for delivery.      Fetal Exam Fetal Monitor Review: Mode: ultrasound.   Baseline rate: No cardiac activity.      Physical Exam    Constitutional: She is oriented to person, place, and time. She appears well-developed and well-nourished. No distress.  HENT:  Head: Normocephalic.  Eyes: EOM are normal.  Neck: Neck supple. No thyromegaly present.  Cardiovascular: Normal rate, regular rhythm and normal heart sounds.   Respiratory: Effort normal and breath sounds normal. She has no wheezes.  GI: Soft. She exhibits no distension. There is no tenderness.  Musculoskeletal: Normal range of motion.  Neurological: She is alert and oriented to person, place, and time. She has normal reflexes.  Skin: Skin is warm and dry.  Psychiatric: She has a normal mood and affect. Her behavior is normal.   Language barrier  Prenatal labs: ABO, Rh: O/Positive/-- (10/16 0000) Antibody: NEG (03/25 1452) Rubella: Immune (10/17 0000) RPR: Nonreactive (10/16 0000)  HBsAg: Negative (10/17 0000)  HIV:   NR GBS: POSITIVE (03/25 2039)  GC/CT neg  Assessment/Plan: [id number] at [redacted]w[redacted]d with fetal demise, not in labor Will evaluate cx when interpreter returns and proceed with cytotec IOL   Nigil Braman 08/05/2011, 12:38 PM

## 2011-08-05 NOTE — Progress Notes (Signed)
No FHM on doppler or [place], ascites and edema noted. Pt informed, offered induction for fetal demise.

## 2011-08-06 ENCOUNTER — Encounter (HOSPITAL_COMMUNITY): Payer: Self-pay | Admitting: *Deleted

## 2011-08-06 DIAGNOSIS — O9989 Other specified diseases and conditions complicating pregnancy, childbirth and the puerperium: Secondary | ICD-10-CM

## 2011-08-06 DIAGNOSIS — O411090 Infection of amniotic sac and membranes, unspecified, unspecified trimester, not applicable or unspecified: Secondary | ICD-10-CM

## 2011-08-06 DIAGNOSIS — O321XX0 Maternal care for breech presentation, not applicable or unspecified: Secondary | ICD-10-CM

## 2011-08-06 DIAGNOSIS — O364XX0 Maternal care for intrauterine death, not applicable or unspecified: Secondary | ICD-10-CM

## 2011-08-06 LAB — HSV 1 ANTIBODY, IGG: HSV 1 Glycoprotein G Ab, IgG: 10.31 IV — ABNORMAL HIGH

## 2011-08-06 LAB — RUBELLA SCREEN: Rubella: 11.6 IU/mL — ABNORMAL HIGH

## 2011-08-06 LAB — TOXOPLASMA GONDII ANTIBODY, IGG: Toxoplasma IgG Ratio: 636 IU/mL — ABNORMAL HIGH (ref <6.4)

## 2011-08-06 MED ORDER — ONDANSETRON HCL 4 MG/2ML IJ SOLN: 4.0000 mg | INTRAMUSCULAR | Status: DC | PRN

## 2011-08-06 MED ORDER — BENZOCAINE-MENTHOL 20-0.5 % EX AERO: 1.0000 "application " | INHALATION_SPRAY | CUTANEOUS | Status: DC | PRN

## 2011-08-06 MED ORDER — TETANUS-DIPHTH-ACELL PERTUSSIS 5-2.5-18.5 LF-MCG/0.5 IM SUSP
0.5000 mL | Freq: Once | INTRAMUSCULAR | Status: AC
  Administered 2011-08-07: 0.5 mL via INTRAMUSCULAR
  Filled 2011-08-06: qty 0.5

## 2011-08-06 MED ORDER — PRENATAL MULTIVITAMIN CH
1.0000 | ORAL_TABLET | Freq: Every day | ORAL | Status: DC
  Administered 2011-08-07: 1 via ORAL
  Filled 2011-08-06: qty 1

## 2011-08-06 MED ORDER — BUTORPHANOL TARTRATE 2 MG/ML IJ SOLN
2.0000 mg | Freq: Once | INTRAMUSCULAR | Status: AC
  Administered 2011-08-06: 2 mg via INTRAVENOUS

## 2011-08-06 MED ORDER — ONDANSETRON HCL 4 MG PO TABS: 4.0000 mg | ORAL_TABLET | ORAL | Status: DC | PRN

## 2011-08-06 MED ORDER — SODIUM CHLORIDE 0.9 % IJ SOLN
3.0000 mL | INTRAMUSCULAR | Status: DC | PRN
  Administered 2011-08-06: 3 mL via INTRAVENOUS

## 2011-08-06 MED ORDER — NALBUPHINE SYRINGE 5 MG/0.5 ML
10.0000 mg | INJECTION | Freq: Once | INTRAMUSCULAR | Status: AC
  Administered 2011-08-06: 10 mg via INTRAMUSCULAR
  Filled 2011-08-06: qty 1

## 2011-08-06 MED ORDER — HYDROXYZINE HCL 50 MG/ML IM SOLN
50.0000 mg | Freq: Once | INTRAMUSCULAR | Status: AC
  Administered 2011-08-06: 50 mg via INTRAMUSCULAR
  Filled 2011-08-06: qty 1

## 2011-08-06 MED ORDER — LANOLIN HYDROUS EX OINT: TOPICAL_OINTMENT | CUTANEOUS | Status: DC | PRN

## 2011-08-06 MED ORDER — ZOLPIDEM TARTRATE 5 MG PO TABS: 5.0000 mg | ORAL_TABLET | Freq: Every evening | ORAL | Status: DC | PRN

## 2011-08-06 MED ORDER — WITCH HAZEL-GLYCERIN EX PADS: 1.0000 "application " | MEDICATED_PAD | CUTANEOUS | Status: DC | PRN

## 2011-08-06 MED ORDER — IBUPROFEN 600 MG PO TABS
600.0000 mg | ORAL_TABLET | Freq: Four times a day (QID) | ORAL | Status: DC
  Administered 2011-08-06: 600 mg via ORAL
  Filled 2011-08-06 (×2): qty 1

## 2011-08-06 MED ORDER — DIPHENHYDRAMINE HCL 25 MG PO CAPS: 25.0000 mg | ORAL_CAPSULE | Freq: Four times a day (QID) | ORAL | Status: DC | PRN

## 2011-08-06 MED ORDER — SENNOSIDES-DOCUSATE SODIUM 8.6-50 MG PO TABS
2.0000 | ORAL_TABLET | Freq: Every day | ORAL | Status: DC
  Administered 2011-08-06: 2 via ORAL

## 2011-08-06 MED ORDER — DIBUCAINE 1 % RE OINT: 1.0000 "application " | TOPICAL_OINTMENT | RECTAL | Status: DC | PRN

## 2011-08-06 MED ORDER — OXYCODONE-ACETAMINOPHEN 5-325 MG PO TABS: 1.0000 | ORAL_TABLET | ORAL | Status: DC | PRN

## 2011-08-06 MED ORDER — SIMETHICONE 80 MG PO CHEW
80.0000 mg | CHEWABLE_TABLET | ORAL | Status: DC | PRN
  Administered 2011-08-06: 80 mg via ORAL

## 2011-08-06 MED ORDER — NALBUPHINE SYRINGE 5 MG/0.5 ML
10.0000 mg | INJECTION | Freq: Once | INTRAMUSCULAR | Status: AC
  Administered 2011-08-06: 10 mg via INTRAVENOUS
  Filled 2011-08-06: qty 1

## 2011-08-06 NOTE — Progress Notes (Signed)
UR Chart review completed.  

## 2011-08-06 NOTE — H&P (Signed)
Attestation of Attending Supervision of Advanced Practitioner: Evaluation and management procedures were performed by the PA/NP/CNM/OB Fellow under my supervision/collaboration. Chart reviewed, and agree with management and plan.  Jaynie Collins, M.D. 08/06/2011 3:16 PM

## 2011-08-06 NOTE — Progress Notes (Signed)
Brightyn Yodice is a 25 y.o. G2P0101 at [redacted]w[redacted]d  admitted for induction of labor due to IUFD. Patient continues to try to rest.   Objective: BP 112/70  Pulse 113  Temp(Src) 100.7 F (38.2 C) (Oral)  Resp 20  Ht 5' 2.25" (1.581 m)  Wt 57.244 kg (126 lb 3.2 oz)  BMI 22.90 kg/m2  LMP 01/15/2011  Breastfeeding? Unknown      FHT:  IUFD SVE:   Dilation: 3.5 Effacement (%): 90 Station: -1 Exam by:: A. [place], [place]  Labs: Lab Results  Component Value Date   WBC 6.3 08/05/2011   HGB 11.3* 08/05/2011   HCT 34.4* 08/05/2011   MCV 85.6 08/05/2011   PLT 223 08/05/2011    Assessment / Plan: IOL for IUFD Patient continues to progress now at 3.5 cm. She has only had 1 cytotec at this time. Patient contracting every minute so will not start another cytotec at this time.  I/D:  amp/gent for chorio Anticipated MOD:  NSVD  HUNTER, STEPHEN 08/06/2011, 2:49 AM

## 2011-08-07 MED ORDER — ZOLPIDEM TARTRATE 5 MG PO TABS: 5.0000 mg | ORAL_TABLET | Freq: Every evening | ORAL | Status: DC | PRN

## 2011-08-07 MED ORDER — IBUPROFEN 600 MG PO TABS: 600.0000 mg | ORAL_TABLET | Freq: Four times a day (QID) | ORAL | Status: AC

## 2011-08-07 NOTE — Discharge Summary (Signed)
Obstetric Discharge Summary Reason for Admission: induction of labor and fetal demise at 24.[redacted] weeks gestation. Diagnoses by [place] at clinic visit 08/05/11 Prenatal Procedures: ultrasound Intrapartum Procedures: spontaneous vaginal delivery Postpartum Procedures: none Complications-Operative and Postpartum: none Hemoglobin  Date Value Range Status  08/05/2011 11.3* 12.0-15.0 (g/dL) Final     HCT  Date Value Range Status  08/05/2011 34.4* 36.0-46.0 (%) Final    Discharge Diagnoses: Postpartum after vaginal delivery of a stillborn infant, cytotec industion of labor  Discharge Information: Date: 08/07/2011 Activity: pelvic rest Diet: routine Medications: Ibuprofen Condition: stable Instructions: Care after delivery Discharge to: home   Newborn Data: Stillborn female Birth Weight: 1 lb 12 oz (794 g)   For autopsy then to mortuary F/u in 3 weeks Gyn clinic Serenitie Vinton 08/07/2011, 12:39 PM

## 2011-08-07 NOTE — Progress Notes (Signed)
Pt d/c home with family, ambulatory in private car.

## 2011-08-08 LAB — PARVOVIRUS B19 ANTIBODY, IGG AND IGM: Parovirus B19 IgM Abs: <0.9 Index (ref <0.9)

## 2011-08-09 ENCOUNTER — Ambulatory Visit (HOSPITAL_COMMUNITY)
Admission: RE | Admit: 2011-08-09 | Payer: Medicaid Other | Source: Ambulatory Visit | Attending: Maternal and Fetal Medicine | Admitting: Maternal and Fetal Medicine

## 2011-08-09 ENCOUNTER — Other Ambulatory Visit: Payer: Self-pay

## 2011-08-11 LAB — TORCH-IGM(TOXO/ RUB/ CMV/ HSV) W TITER
CMV IgM: <0.2
Toxoplasma IgM: POSITIVE

## 2011-09-01 ENCOUNTER — Ambulatory Visit (INDEPENDENT_AMBULATORY_CARE_PROVIDER_SITE_OTHER): Payer: Medicaid Other | Admitting: Physician Assistant

## 2011-09-01 ENCOUNTER — Encounter: Payer: Self-pay | Admitting: Physician Assistant

## 2011-09-01 VITALS — BP 103/65 | HR 83 | Temp 96.9°F | Resp 16 | Ht 62.0 in | Wt 121.3 lb

## 2011-09-01 DIAGNOSIS — Z309 Encounter for contraceptive management, unspecified: Secondary | ICD-10-CM

## 2011-09-01 NOTE — Patient Instructions (Signed)
Postpartum Depression After delivery, your body is going through a drastic change in hormone levels. You may find yourself crying for no apparent reason and unable to cope with all the changes a new baby brings. This is a common response following a pregnancy. Seek support from your partner and/or friends and just give yourself time to recover. If these feelings persist and you feel you are getting worse, contact your caregiver or other professionals who can help you. WHAT IS DEPRESSION? Depression can be described as feeling sad, blue, unhappy, miserable, or down in the dumps. Most of us feel this way at one time or another for short periods. But true clinical depression is a mood disorder in which feelings of sadness, loss, anger, fear, or frustration interfere with everyday life for an extended time. Depression can be mild, moderate, or severe. The degree of depression, which your caregiver can determine, influences your treatment. Postpartum depression occurs within a couple days to months after delivering your baby. HOW COMMON IS DEPRESSION DURING AND AFTER PREGNANCY? Depression that occurs during pregnancy or within a year after delivery is called perinatal depression. Depression after pregnancy is also called postpartum depression or peripartum depression. The exact number of women with depression during this time is unknown, but it occurs in between 10-15% of women. Researchers believe that depression is one of the most common complications during and after pregnancy. The depression is often not recognized or treated, because some normal pregnancy changes cause similar symptoms and are happening at the same time. Tiredness, problems sleeping, stronger emotional reactions, and changes in body weight may occur during and after pregnancy. But these symptoms may also be signs of depression.  CAUSES  Rapid hormone changes. Estrogen and progesterone usually decrease immediately after delivering your  baby. Researchers think the fast change in hormone levels may lead to depression, just as smaller changes in hormones can affect a woman's moods before she gets her menstrual period.   Decrease in thyroid hormone. Thyroid hormone regulates how your body uses and stores energy from food (metabolism). A simple blood test can tell if this condition is causing a woman's depression. If so, thyroid medicine can be prescribed by your caregiver.   A stressful life event, such as a death in the family. This can cause chemical changes in the brain that lead to depression.   Feeling overwhelmed by caring for and raising a new baby.   Depression is also an illness that runs in some families. It is not always clear what causes depression.  FACTORS THAT MAY INCREASE A WOMAN'S CHANCE OF DEPRESSION DURING PREGNANCY:  History of depression.   Substance abuse, alcohol, or drugs.   Little support from family and friends.   Problems with previous pregnancy or birth.   Young age for motherhood.   Living alone.   Little or no social support.   Family history of mental illness.   Anxiety about the fetus.   Marital or financial problems.   Postpartum depression in a previous pregnancy.   Having a psychiatric illness (schizophrenia, bipolar disorder).   Going through a difficult or stressful pregnancy.   Going through a difficult labor and delivery.   Moving to another city or state during your pregnancy, or just after delivering your baby.  OTHER FACTORS THAT MAY CONTRIBUTE TO POSTPARTUM DEPRESSION INCLUDE:   Feeling tired after delivery, broken sleep patterns, and not getting enough rest. This often keeps a new mother from regaining her full strength for weeks.   Feeling   overwhelmed with a new baby to take care of and doubting your ability to be a good mother.   Feeling stress from changes in work and home routines. Women sometimes think they need to be "super mom" or perfect. This is not  realistic and can add stress.   Having feelings of loss. This can include loss of the identity of who you are, or were, before having the baby, loss of control, loss of your pre-pregnancy figure, and feeling less attractive.   Having less free time and less control over your time. Needing to stay home, indoors, for longer periods of time and having less time to spend with your partner and loved ones can contribute to depression.   Having trouble doing your daily activities at home or at work.   Fears about not knowing how to take of the baby correctly and about harming the baby.   Feelings of guilt that you are not taking care of the baby properly.  SYMPTOMS Any of these symptoms, during and after pregnancy, that last longer than 2 weeks are signs of depression:  Feeling restless or irritable.   Feeling sad, hopeless, and overwhelmed.   Crying a lot.   Having no energy or motivation.   Eating too little or too much.   Sleeping too little or too much.   Trouble focusing, remembering, or making decisions.   Feeling worthless and guilty.   Loss of interest or pleasure in activities.   Withdrawal from friends and family.   Having headaches, chest pains, rapid or irregular heartbeat (palpitations), or fast and shallow breathing (hyperventilation).   After pregnancy, being afraid of hurting the baby or oneself, and not having any interest in the baby.   Not being able to care for yourself or the baby.   Loss of interest in caring for the baby.   Anxiety and panic attacks.   Thoughts of harming yourself, the baby, or someone else.   Feelings of guilt because you feel you are not taking care of the baby well enough.  WHAT IS THE DIFFERENCE BETWEEN "BABY BLUES," POSTPARTUM DEPRESSION, AND POSTPARTUM PSYCHOSIS?  The "baby blues" occurs 70 to 80% of the time, and it can happen in the days right after childbirth. It normally goes away within a few days to a week. A new mother can  have sudden mood swings, sadness, crying spells, loss of appetite, sleeping problems, and feel irritable, restless, anxious, and lonely. Symptoms are not severe and treatment usually is not needed. But there are things you can do to feel better. Nap when the baby does. Ask for help from your spouse, family members, and friends. Join a support group of new moms or talk with other moms. If the "baby blues" does not go away in a week to 10 days or gets worse, you may have postpartum depression.   Postpartum depression can happen anytime within the first year after childbirth. A woman may have a number of symptoms, such as sadness, lack of energy, trouble concentrating, anxiety, and feelings of guilt and worthlessness. The difference between postpartum depression and the "baby blues" is that the feelings in postpartum depression are much stronger and often affects a woman's well-being. It keeps her from functioning well for a longer period of time. Postpartum depression needs to be treated by a caregiver. Counseling, support groups, and medicines can help.   Postpartum psychosis is rare. It occurs in 1 or 2 out of every 1000 births. It usually begins in   the first 6 weeks after delivery. Women who have bipolar disorder, schizoaffective disorder, or family history of psychotic disease have a higher risk for developing postpartum psychosis. Symptoms may include delusions, hallucinations, sleep disturbances, and obsessive thoughts about the baby. A woman may have rapid mood swings, from depression, to irritability, to euphoria. This is a serious condition and needs professional care and treatment.  WHAT STEPS CAN I TAKE IF I HAVE SYMPTOMS OF DEPRESSION DURING PREGNANCY OR AFTER CHILDBIRTH?  Some women do not tell anyone about their symptoms, because they feel embarrassed, ashamed, or guilty about feeling depressed when they are supposed to be happy. They worry that they will be viewed as unfit parents. Perinatal  depression can happen to any woman. It does not mean you are a bad or a "not together" mom. You and your baby do not need to suffer. There is help. You should discuss these feelings with your spouse or partner, family, and caregiver.   There are different types of individual and group "talk therapies" that can help a woman with perinatal depression feel better and do better as a mom and as a person. Limited research suggests that many women with perinatal depression improve when treated with antidepressant medicine. Your caregiver can help you learn more about these options and decide which approach is best for you and your baby.   Speak to your caregiver if you are having symptoms of depression while you are pregnant or after you deliver your baby. Your caregiver can give you a questionnaire to test for depression. You can also be referred to a mental health professional who specializes in treating depression.  HOME CARE INSTRUCTIONS  Try to get as much rest as you can. Try to nap when the baby naps.   Stop putting pressure on yourself to do everything. Do as much as you can and leave the rest.   Ask for help with household chores and nighttime feedings. Ask your partner to bring the baby to you so you can breastfeed. If you can, have a friend, family member, or professional support person help you in the home for part of the day.   Talk to your partner, family, and friends about how you are feeling.   Do not spend a lot of time alone. Get dressed and leave the house. Run an errand or take a short walk.   Spend time alone with your partner.   Talk with other mothers so you can learn from their experiences.   Join a support group for women with depression. Call a local hotline or look in your telephone book for information and services.   Do not make any major life changes during pregnancy. Major changes can cause unneeded stress. However, sometimes big changes cannot be avoided. Arrange  support and help in your new situation ahead of time.   Exercise regularly.   Eat a balanced and nourishing diet.   Seek help if there are marital or financial problems.   Take the medicine your caregiver gives, as directed.   Keep all your postpartum appointments.  TREATMENT There are 2 common types of treatment for depression.  Talk therapy. This involves talking to a therapist, psychologist, clergyperson, or social worker, in order to learn to change how depression makes you think, feel, and act.   Medicine. Your caregiver can give you an antidepressant medicine to help you. These medicines can help relieve the symptoms of depression.   Women who are pregnant or breast-feeding should talk with   their caregivers about the advantages and risks of taking antidepressant medicines. Some women are concerned that taking these medicines may harm the baby. A mother's depression can affect her baby's development. Getting treatment is important for both mother and baby. The risks of taking medicine must be weighed against the risks of depression. It is a decision that women need to discuss carefully with their caregivers. Women who decide to take antidepressant medicines should talk to their caregivers about which antidepressant medicines are safer to take while pregnant or breastfeeding.  What effects can untreated depression have?  Depression not only hurts the mother, but it also affects her family. Some researchers have found that depression during pregnancy can raise the risk of delivering an underweight baby or a premature infant. Some women with depression have difficulty caring for themselves during pregnancy. They may have trouble eating and do not gain enough weight during the pregnancy. They may also have trouble sleeping, may miss prenatal visits, may not follow medical instructions, have a poor diet, or may use harmful substances, like tobacco, alcohol, or illegal drugs.   Postpartum  depression can affect a mother's ability to parent. She may lack energy, have trouble concentrating, be irritable, and not be able to meet her child's needs for love and affection. As a result, she may feel guilty and lose confidence in herself as a mother. This can make the depression worse. Researchers believe that postpartum depression can affect the infant by causing delays in language development, problems with emotional bonding to others, behavioral problems, lower activity levels, sleep problems, and distress. It helps if the father or another caregiver can assist in meeting the needs of the baby, and other children in the family, while the mother is depressed.   All children deserve the chance to have a healthy mom. All moms deserve the chance to enjoy their life and their children. Do not suffer alone. If you are experiencing symptoms of depression during pregnancy or after having a baby, tell a loved one and call your caregiver right away.  SEEK MEDICAL CARE IF:  You think you have postpartum depression.   You want medicine to treat your postpartum depression.   You want a referral to a psychiatrist or psychologist.   You are having a reaction or problems with your medicine.  SEEK IMMEDIATE MEDICAL CARE IF:  You have suicidal feelings.   You feel you may harm the baby.   You feel you may harm your spouse/partner, or someone else.   You feel you need to be admitted to a hospital now.   You feel you are losing control and need treatment immediately.  FOR MORE INFORMATION [employer] operational [employer] Health Information Center: http://hoffman.com/ National Institute of Mental Health, NIH, HHS: http://www.maynard.net/ American Psychological Association: DiceTournament.ca  Postpartum Education for Parents: www.sbpep.org National Mental Health Information Center, SAMHSA, HHS: www.mentalhealth.org  National Mental Health Association: www.nmha.org Postpartum Support International: www.postpartum.net    Document Released: 04/29/2004 Document Revised: 04/07/2011 Document Reviewed: 08/07/2009 [facility] Patient Information 2012 [place], [place].Birth Control Choices Birth control is the use of any practices, methods, or devices to prevent pregnancy from happening in a sexually active woman.  Below are some birth control choices to help avoid pregnancy.  Not having sex (abstinence) is the surest form of birth control. This requires self-control. There is no risk of acquiring a sexually transmitted disease (STD), including acquired immunodeficiency syndrome (AIDS).   Periodic abstinence requires self-control during certain times of the month.   Calendar method, timing your  menstrual periods from month to month.   Ovulation method is avoiding sexual intercourse around the time you produce an egg (ovulate).   Symptotherm method is avoiding sexual intercourse at the time of ovulation, using a thermometer and ovulation symptoms.   Post ovulation method is the timing of sexual intercourse after you ovulated.  These methods do not protect against STDs, including AIDS.  Birth control pills (BCPs) contain estrogen and progesterone hormone. These medicines work by stopping the egg from forming in the ovary (ovulation). Birth control pills are prescribed by a caregiver who will ask you questions about the risks of taking BCPs. Birth control pills do not protect against STDs, including AIDS.   "Minipill" birth control pills have only the progesterone hormone. They are taken every day of each month and must be prescribed by your caregiver. They do not protect against STDs, including AIDS.   Emergency contraception is often call the "morning after" pill. This pill can be taken right after sex or up to five days after sex if you think your birth control failed, you failed to use contraception, or you were forced to have sex. It is most effective the sooner you take the pills after having sexual intercourse. Do  not use emergency contraception as your only form of birth control. Emergency contraceptive pills are available without a prescription. Check with your pharmacist.   Condoms are a thin sheath of latex, synthetic material, or lambskin worn over the penis during sexual intercourse. They can have a spermicide in or on them when you buy them. Latex condoms can prevent pregnancy and STDs. "Natural" or lambskin condoms can prevent pregnancy but may not protect against STDs, including AIDS.   Female condoms are a soft, loose-fitting sheath that is put into the vagina before sexual intercourse. They can prevent pregnancy and STDs, including AIDS.   Sponge is a soft, circular piece of polyurethane foam with spermicide in it that is inserted into the vagina after wetting it and before sexual intercourse. It does not require a prescription from your caregiver. It does not protect against STDs, including AIDS.   Diaphragm is a soft, latex, dome-shaped barrier that must be fitted by a caregiver. It is inserted into the vagina, along with a spermicidal jelly. After the proper fitting for a diaphragm, always insert the diaphragm before intercourse. The diaphragm should be left in the vagina for 6 to 8 hours after intercourse. Removal and reinsertion with a spermicide is always necessary after any use. It does not protect against STDs, including AIDS.   Progesterone-only injections are given every 3 months to prevent pregnancy. These injections contain synthetic progesterone and no estrogen. This hormone stops the ovaries from releasing eggs. It also causes the cervical mucus to thicken and changes the uterine lining. This makes it harder for sperm to survive in the uterus. It does not protect against STDs, including AIDS.   Birth Control Patch contains hormones similar to those in birth control pills, so effectiveness, risks, and side effects are similar. It must be changed once a week and is prescribed by a caregiver.  It is less effective in very overweight women. It does not protect against STDs, including AIDS.   Vaginal Ring contains hormones similar to those in birth control pills. It is left in place for 3 weeks, removed for 1 week, and then a new one is put back into the vagina. It comes with a timer to put in your purse to help you remember when to  take it out or put a new one in. A caregiver's examination and prescription is necessary, just like with birth control pills and the patch. It does not protect against STDs, including AIDS.   Estrogen plus progesterone injections are given every 28 to 30 days. They can be given in the upper arm, thigh, or buttocks. It does not protect against STDs, including AIDS.   Intrauterine device (IUD): copper T or progestin filled is a T-shaped device that is put in a woman's uterus during a menstrual period to prevent pregnancy. The copper T IUD can last 10 years, and the progestin IUD can last 5 years. The progestin IUD can also help control heavy menstrual periods. It does not protect against STDs, including AIDS. The copper T IUD can be used as emergency contraception if inserted within 5 days of having unprotected intercourse.   Cervical cap is a round, soft latex or plastic cup that fits over the cervix and must be fitted by a caregiver. You do not need to use a spermicide with it or remove and insert it every time you have sexual intercourse. It does not protect against STDs, including AIDS.   Spermicides are chemicals that kill or block sperm from entering the cervix and uterus. They come in the form of creams, jellies, suppositories, foam, or tablets, and they do not require a prescription. They are inserted into the vagina with an applicator before having sexual intercourse. This must be repeated every time you have sexual intercourse.   Withdrawal is using the method of the female withdrawing his penis from sexual intercourse before he has a climax and deposits his  sperm. It does not protect against STDs, including AIDS.   Female tubal ligation is when the woman's fallopian tubes are surgically sealed or tied to prevent the egg from traveling to the uterus. It does not protect against STDs, including AIDS.   Female sterilization is when the female has his tubes that carry sperm tied off (vasectomy) to stop sperm from entering the vagina during sexual intercourse. It does not protect against STDs, including AIDS.  Regardless of which method of birth control you choose, it is still important that you use some form of protection against STDs. Document Released: 07/26/2005 Document Revised: 08/28/2010 Document Reviewed: 06/12/2009 [facility] Patient Information 2012 [place], [place].

## 2011-09-01 NOTE — Progress Notes (Signed)
Pt reports having H/A every day- has not taken any medication.  Pt denies sadness or depressed feelings. Pt states she is participating in activities with family and friends.

## 2011-09-01 NOTE — Progress Notes (Signed)
Chief Complaint:  Follow-up   [name] [name] is  26 y.o. [id number].  No LMP recorded. Patient is not currently having periods (Reason: Other)..   She presents complaining of Follow-up  3 weeks pp from SVD after IUFD at 24 weeks. Autopsy pending. Reports scant vaginal d/c. Denies pain or leaking for breast. Depression score: 0.   Obstetrical/Gynecological History: OB History    Grav Para Term Preterm Abortions TAB SAB Ect Mult Living   2 2 0 2 0 0 0 0 0 1       Past Medical History: Past Medical History  Diagnosis Date  . No pertinent past medical history     Past Surgical History: Past Surgical History  Procedure Date  . No past surgeries     Family History: Family History  Problem Relation Age of Onset  . Kidney disease Father     Social History: History  Substance Use Topics  . Smoking status: Never Smoker   . Smokeless tobacco: Never Used  . Alcohol Use: No    Allergies: No Known Allergies   Review of Systems - Negative except what has been reviewed in HPI  Physical Exam   Blood pressure 103/65, pulse 83, temperature 96.9 F (36.1 C), temperature source Oral, resp. rate 16, height 5\' 2"  (1.575 m), weight 121 lb 4.8 oz (55.021 kg).  General: General appearance - alert, well appearing, and in no distress, oriented to person, place, and time and overweight Mental status - alert, oriented to person, place, and time, normal mood, behavior, speech, dress, motor activity, and thought processes, affect appropriate to mood Focused Gynecological Exam: examination not indicated    Assessment: 3 week s/p SVD of IUFD at 24 weeks: Doing Well Contraceptive Counselling  Plan: Desires OCPs Rx: sprintec sent to pharmacy FU in 3-4 weeks for BP check, depression screening  Bingham Millette E. 09/01/2011,4:09 PM

## 2011-09-10 DEATH — deceased

## 2011-09-22 ENCOUNTER — Encounter: Payer: Self-pay | Admitting: Obstetrics & Gynecology

## 2011-09-22 ENCOUNTER — Ambulatory Visit (INDEPENDENT_AMBULATORY_CARE_PROVIDER_SITE_OTHER): Payer: Medicaid Other | Admitting: Obstetrics & Gynecology

## 2011-09-22 VITALS — BP 106/74 | HR 84 | Temp 97.5°F | Resp 16 | Ht 62.0 in | Wt 122.9 lb

## 2011-09-22 DIAGNOSIS — Z3041 Encounter for surveillance of contraceptive pills: Secondary | ICD-10-CM

## 2011-09-22 MED ORDER — NORGESTIM-ETH ESTRAD TRIPHASIC 0.18/0.215/0.25 MG-25 MCG PO TABS: 1.0000 | ORAL_TABLET | Freq: Every day | ORAL | Status: DC

## 2011-09-22 NOTE — Patient Instructions (Signed)
Oral Contraceptives Oral contraceptives (OCs) are medicines taken to prevent pregnancy. They are the most widely used method of birth control. OCs work by preventing the ovaries from releasing eggs. The OC hormones also cause the mucus on the cervix to thicken, preventing the sperm from entering the uterus. They also cause the lining of the uterus to become thin, not allowing a fertilized egg to attach to the inside of the uterus. OCs have a failure rate of less than 1%, when taken exactly as prescribed. THERE ARE 2 TYPES OF OC  OC that contains a mix of estrogen and progesterone hormones is the most common OC used. It is taken for 21 days, followed by 7 days of not taking the OC hormones. It can be packaged as 28 pills, with the last 7 pills being inactive. You take a pill every day. This way you do not need to remember when to restart taking the active pills. Most women will begin their menstrual period 2 to 3 days after taking the hormone pill. The menstrual period is usually lighter and shorter. This combination OC should not be taken if you are breast-feeding.   The progesterone only (minipill) OC does not contain estrogen. It is taken every day, continuously. You may have only spotting for a period, or no period at all. The progesterone only OC can be taken if you are breast-feeding your baby.  OCs come in:  Packs of 21 pills, with no pills to take for 7 days after the last pill.   Packs of 28 pills, with a pill to take every day. The last 7 pills are without hormones.   Packs of 91 pills (continuous or extended use), with a pill to take every day. The first 84 pills contain the hormones, and the last 7 pills do not. That is when you will have your menstrual period. You will not have a menstrual period during the time you are taking the first 84 pills.  HOW TO TAKE OC Your caregiver may advise you on how to start taking the first cycle of OCs. Otherwise, you can:  Start on day 1 or day 5 of  your menstrual period, taking the first pack of the OC. You will not need any backup contraceptive protection with this start time.   Start on the first Sunday after your menstrual period, day 7 of your menstrual period, or the day you get your prescription. In these cases, you will need backup contraceptive protection for the first cycle.  No matter which day you start the OC, you will always start a new pack on that same day of the week. It is a good idea to have an extra pack of OCs and a backup contraceptive method available, in case you miss some pills or lose your OC pack. COMMON REASONS FOR FAILURE   Forgetting to take the pill at the same time every day.   Poor absorption of the pill from the stomach into the bloodstream. This can be caused by diarrhea, vomiting, and the use of some medicines that kill germs (antibiotics).   Stomach or intestinal disease.   Taking OCs with other medicines that may make them less effective (carbamazepine, phenytoin, phenobarbital, rifampin).   Using OCs that have passed their expiration dates.   Forgetting to restart the pills on day 7, when using the packs of 21 pills.  If you forget to take 1 pill, take it as soon as you remember, and take the next pill at the   regular time. If you miss 2 or more pills, use backup birth control until your next menstrual period starts. Also, you may have vaginal spotting or bleeding when you miss 2 or more OC pills. If you use the pack of 28 pills or 91 pills, and you miss 1 of the last 7 pills (pills with no hormones), it will not matter. Just throw away the rest of the non-hormone pills and start a new 28 or 91 pill pack. COMMON USES OF OC  Decreasing premenstrual problems (symptoms).   Treating menstrual period cramps.   Avoiding becoming pregnant.   Regulating the menstrual cycle.   Treating acne.   Decreasing the heavy menstrual flow.   Treating dysfunctional (abnormal) uterine bleeding.   Treating  chronic pelvic pain.   Treating polycystic ovary syndrome (ovary does not ovulate and produces tiny cysts).   Treating endometriosis (uterus lining growing in the pelvis, tubes, and ovaries).   Can be used for emergency contraception.  OCs DO NOT prevent sexually transmitted diseases (STDs). Safer sex practices, such as using condoms along with the pill, can help prevent STDs.  BENEFITS  OC reduces the risk of:   Cancer of the ovary and uterus.   Ovarian cysts.   Pelvic infection.   Symptoms of polycystic ovary syndrome.   Loss of bone (osteoporosis).   Noncancerous (benign) breast disease (fibrocystic breast changes).   Lack of red blood cells (anemia) from heavy or long menstrual periods.   Pregnancy occurring outside the uterus (tubal pregnancy).   Acne.   Slows down the flow of heavy menstrual periods.   Sometimes helps control premenstrual syndrome (PMS).   Stops menstrual cramps and pain.   Controls irregular menstrual periods.   Can be used as emergency contraception.  YOU SHOULD NOT TAKE THE PILL IF YOU:  Are pregnant, or are trying to get pregnant.   Have unexplained or abnormal vaginal bleeding.   Have a history of liver disease, stroke, or heart attack.   Smoke.   Have a history of blood clots, cancer, or heart problems.   Have gallbladder disease.   Have breast cancer or suspect breast cancer.   Have or suspect pelvic cancer.   Have high blood pressure.   Have high cholesterol or high triglycerides.   Have mental depression.   Are breast-feeding, except for the progesterone only OC, with approval of your caregiver.   Have diabetes with kidney, eye, or other blood vessel complications. Or if you have diabetes for 20 years or more.   Have heart valve disease.   Have migraine headaches. They may get worse.  Before taking the pill, a woman will have a physical exam and Pap test. Your caregiver may order blood tests to check blood sugar and  cholesterol levels, and other blood tests that may be necessary. SIDE EFFECTS OF THE PILL MAY INCLUDE:  Breast tenderness, pain and discharge.   Change in sex drive (increased or decreased libido).   Depression.   Being tired often.   Headaches.   Anxiety.   Irregular spotting or vaginal bleeding for a couple of months.   Leg pain.   Cramps, or swelling of your limbs (extremities).   Mood swings.   Weight loss or weight gain.   Feeling sick to your stomach (nausea).   Change in appetite (hunger).   Loss of hair.   Yeast or fungus vaginal infection.   Nervousness.   Rash.   Acne.   No menstrual period (amenorrhea).  When   starting an OC, it is usually best to allow 2-3 months, if possible, for the body to adjust (before stopping because of side effects). This allows for adjustment to the changes in hormone levels. If a woman continues to have side effects, it may be possible to change to a different OC. It is important to discuss side effects with your caregiver. Often, changing to a different pill causes the side effects to subside. RISKS AND COMPLICATIONS   Blood clots of the leg, heart, lung, or brain.   High blood pressure.   Gallbladder disease.   Liver tumors.   Brain bleeding (hemorrhage).   Slight risk of breast cancer.  HOME CARE INSTRUCTIONS   Do not smoke.   Only take over-the-counter or prescription medicines for pain, discomfort, fever, or breast tenderness as directed by your caregiver.   Always use a condom to protect against sexually transmitted disease. OCs do not protect against STDs.   Keep a calendar, marking your menstrual period days.  Recommendations, types, and dosages of OC use change continually. Discuss your choices with your caregiver, and decide what is best for you. There are always exceptions to guidelines. You should always read the information that comes with the OC, and check whether there are any new recommendations or  guidelines. SEEK MEDICAL CARE IF:   You develop nausea and vomiting from the OC.   You have abnormal vaginal discharge.   You need treatment for headaches.   You develop a rash.   You miss your menstrual period.   You develop abnormal vaginal bleeding.   You are losing your hair.   You need treatment for mood swings or depression.   You get dizzy when taking the OC.   You develop acne from taking the OC.  SEEK IMMEDIATE MEDICAL CARE IF:   You develop leg pain.   You develop chest pain.   You develop shortness of breath.   You develop abdominal pain.   You have an uncontrolled headache.   You develop numbness or slurred speech.   You develop visual problems (loss of vision, double, or blurry vision).   You develop heavy vaginal bleeding.  If you are taking the pill, STOP RIGHT AWAY and CALL YOUR CAREGIVER IMMEDIATELY if the following occur:  You develop chest pain and shortness of breath.   You develop pain, redness, and swelling in the legs.   You develop severe headaches, visual changes, or belly (abdominal) pain.   You develop severe depression.   You become pregnant.  Document Released: 10/16/2002 Document Revised: 08/28/2010 Document Reviewed: 08/07/2009 [employer] Patient Information 2012 [employer]. 

## 2011-09-22 NOTE — Progress Notes (Signed)
Subjective:     [name] [name] is a 26 y.o. female who presents for a postpartum visit. She is 6 week postpartum following a spontaneous vaginal delivery. I have fully reviewed the prenatal and intrapartum course. The delivery was at 24 gestational weeks. Outcome: spontaneous vaginal delivery. Anesthesia: none. Postpartum course has been normal.she was induced for fetal demise.IUI treated with antibiotics. Bleeding moderate lochia and red. Bowel function is normal. Bladder function is normal. Patient is sexually active. Contraception method is none. Postpartum depression screening: negative.  The following portions of the patient's history were reviewed and updated as appropriate: allergies, current medications, past family history, past medical history, past social history, past surgical history and problem list.  Review of Systems Pertinent items are noted in HPI.   Objective:    BP 106/74  Pulse 84  Temp(Src) 97.5 F (36.4 C) (Oral)  Resp 16  Ht 5\' 2"  (1.575 m)  Wt 122 lb 14.4 oz (55.747 kg)  BMI 22.48 kg/m2  General:  alert, cooperative and no distress   Breasts:  not examined  Lungs: not examined  Heart:  not examined  Abdomen: soft, non-tender; bowel sounds normal; no masses,  no organomegaly   Vulva:  normal  Vagina: normal vagina and vaginal bleeding  Cervix:  no lesions  Corpus: normal  Adnexa:  normal adnexa  Rectal Exam: Not performed.        Assessment:    Normal postpartum exam. Pap smear not done at today's visit.   Plan:    1. Contraception: OCP (estrogen/progesterone) 2. TriSprintec 3.Consider f/u at [facility] 3. Follow up in: 6 months or as needed.

## 2012-08-09 NOTE — L&D Delivery Note (Signed)
Attestation of Attending Supervision of Advanced Practitioner: Evaluation and management procedures were performed by the PA/NP/CNM/OB Fellow under my supervision/collaboration. Chart reviewed and agree with management and plan.  Jathniel Smeltzer V 02/06/2013 1:34 PM

## 2012-08-09 NOTE — L&D Delivery Note (Signed)
Delivery Note At 10:28 AM a viable female was delivered via Vaginal, Spontaneous Delivery (Presentation: Left Occiput Anterior).  APGAR: 8, 9; weight .   Placenta status: Intact, Spontaneous.  Cord: 3 vessels with the following complications: None.    Anesthesia: None  Episiotomy:  Lacerations: None  Est. Blood Loss (mL): 500, uterus boggy with large gush immediately postpartum, fundus firm with massage, removal of clots and pitocin IV. Will given cytotec 600 mcg PO for PPH prophylaxis.   Mom to postpartum.  Baby to nursery-stable.  Ambur Province 02/01/2013, 10:42 AM

## 2012-09-14 ENCOUNTER — Ambulatory Visit (INDEPENDENT_AMBULATORY_CARE_PROVIDER_SITE_OTHER): Payer: Self-pay | Admitting: Family Medicine

## 2012-09-14 ENCOUNTER — Encounter: Payer: Self-pay | Admitting: Family Medicine

## 2012-09-14 VITALS — BP 101/69 | Temp 98.1°F | Ht 61.75 in | Wt 123.6 lb

## 2012-09-14 DIAGNOSIS — O0990 Supervision of high risk pregnancy, unspecified, unspecified trimester: Secondary | ICD-10-CM | POA: Insufficient documentation

## 2012-09-14 DIAGNOSIS — O09299 Supervision of pregnancy with other poor reproductive or obstetric history, unspecified trimester: Secondary | ICD-10-CM

## 2012-09-14 DIAGNOSIS — Z8759 Personal history of other complications of pregnancy, childbirth and the puerperium: Secondary | ICD-10-CM

## 2012-09-14 DIAGNOSIS — Z23 Encounter for immunization: Secondary | ICD-10-CM

## 2012-09-14 DIAGNOSIS — O09219 Supervision of pregnancy with history of pre-term labor, unspecified trimester: Secondary | ICD-10-CM

## 2012-09-14 LAB — POCT URINALYSIS DIP (DEVICE)
Bilirubin Urine: NEGATIVE
Glucose, UA: NEGATIVE mg/dL
Nitrite: NEGATIVE

## 2012-09-14 MED ORDER — INFLUENZA VIRUS VACC SPLIT PF IM SUSP: 0.5000 mL | Freq: Once | INTRAMUSCULAR | Status: DC

## 2012-09-14 MED ORDER — PRENATAL VITAMINS PLUS 27-1 MG PO TABS: 1.0000 | ORAL_TABLET | Freq: Every day | ORAL | Status: DC

## 2012-09-14 MED ORDER — HYDROXYPROGESTERONE CAPROATE 250 MG/ML IM OIL: 250.0000 mg | TOPICAL_OIL | Freq: Once | INTRAMUSCULAR | Status: DC

## 2012-09-14 MED ORDER — PRENATAL VITAMINS PLUS 27-1 MG PO TABS: 1.0000 | ORAL_TABLET | Freq: Every day | ORAL | Status: AC

## 2012-09-14 NOTE — Progress Notes (Signed)
Nutrition note: Pt has gained 3.6# @ [redacted]w[redacted]d, which is < expected. Pt reports eating 1 meal (lunch) & 5-6 snacks/ d (ex. pizza, fruit). Pt reports drinking water, milk & juice daily. Pt is taking PNV. Pt reports N&V daily & heartburn occ. Pt received verbal & written education on general nutrition education via a translator Counselling psychologist). Disc tips to decrease N&V and encouraged pt to combine protein with all her meals & snacks. Disc wt gain goals of 25-35# or 1#/wk. Pt agrees to continue PNV. Pt has WIC & plans to BF. F/u if referred Blondell Reveal, MS, RD, LDN

## 2012-09-14 NOTE — Progress Notes (Signed)
   Subjective:    [name] [name] is a [id number] [redacted]w[redacted]d being seen today for her first obstetrical visit.  Her obstetrical history is significant for h/o PTB, H/o 24 wk IUFD possibly related to Toxo with ascites, abnl AFP, + IgM for Toxo. Patient does intend to breast feed. Pregnancy history fully reviewed.  Patient reports no complaints.  Filed Vitals:   09/14/12 0934  BP: 101/69  Temp: 98.1 F (36.7 C)  Weight: 123 lb 9.6 oz (56.065 kg)    HISTORY: OB History    Grav Para Term Preterm Abortions TAB SAB Ect Mult Living   3 2 0 2 0 0 0 0 0 1      # Outc Date GA Lbr Len/2nd Wgt Sex Del Anes PTL Lv   1 PRE 3/12 [redacted]w[redacted]d 01:00 3lb1oz(1.389kg) M SVD      Comments: PTL, PTD   2 PRE 12/12 [redacted]w[redacted]d 00:00 1lb12oz(0.794kg) F SVD None  SB   3 CUR              Past Medical History  Diagnosis Date  . No pertinent past medical history    Past Surgical History  Procedure Date  . No past surgeries    Family History  Problem Relation Age of Onset  . Kidney disease Father      Exam    Uterus:  Fundal Height: 17 cm  Pelvic Exam:    Perineum: Normal Perineum   Vulva: Bartholin's, Urethra, Skene's normal   Vagina:  normal mucosa, normal discharge       Cervix: multiparous appearance and no lesions   Adnexa: normal adnexa   Bony Pelvis: average  System:     Skin: normal coloration and turgor, no rashes    Neurologic: oriented   Extremities: normal strength, tone, and muscle mass   HEENT sclera clear, anicteric   Mouth/Teeth mucous membranes moist, pharynx normal without lesions   Neck supple   Cardiovascular: regular rate and rhythm   Respiratory:  appears well, vitals normal, no respiratory distress, acyanotic, normal RR, ear and throat exam is normal, neck free of mass or lymphadenopathy, chest clear, no wheezing, crepitations, rhonchi, normal symmetric air entry   Abdomen: soft, non-tender; bowel sounds normal; no masses,  no organomegaly          Assessment:    Pregnancy:  [id number] Patient Active Problem List  Diagnosis  . Pregnancy with history of pre-term labor  . History of stillbirth  . Supervision of high-risk pregnancy        Plan:     Initial labs drawn. Prenatal vitamins. Problem list reviewed and updated. Genetic Screening discussed Quad Screen: ordered. Ultrasound discussed; fetal survey: ordered. Follow up in 1 weeks. Flu shot today Order 17-P and start next wk. MFM consult with anatomy.    Zulema Pulaski S 09/14/2012

## 2012-09-14 NOTE — Patient Instructions (Signed)
Pregnancy - Second Trimester The second trimester of pregnancy (3 to 6 months) is a period of rapid growth for you and your baby. At the end of the sixth month, your baby is about 9 inches long and weighs 1 1/2 pounds. You will begin to feel the baby move between 18 and 20 weeks of the pregnancy. This is called quickening. Weight gain is faster. A clear fluid (colostrum) may leak out of your breasts. You may feel small contractions of the womb (uterus). This is known as false labor or Braxton-Hicks contractions. This is like a practice for labor when the baby is ready to be born. Usually, the problems with morning sickness have usually passed by the end of your first trimester. Some women develop small dark blotches (called cholasma, mask of pregnancy) on their face that usually goes away after the baby is born. Exposure to the sun makes the blotches worse. Acne may also develop in some pregnant women and pregnant women who have acne, may find that it goes away. PRENATAL EXAMS  Blood work may continue to be done during prenatal exams. These tests are done to check on your health and the probable health of your baby. Blood work is used to follow your blood levels (hemoglobin). Anemia (low hemoglobin) is common during pregnancy. Iron and vitamins are given to help prevent this. You will also be checked for diabetes between 24 and 28 weeks of the pregnancy. Some of the previous blood tests may be repeated.  The size of the uterus is measured during each visit. This is to make sure that the baby is continuing to grow properly according to the dates of the pregnancy.  Your blood pressure is checked every prenatal visit. This is to make sure you are not getting toxemia.  Your urine is checked to make sure you do not have an infection, diabetes or protein in the urine.  Your weight is checked often to make sure gains are happening at the suggested rate. This is to ensure that both you and your baby are  growing normally.  Sometimes, an ultrasound is performed to confirm the proper growth and development of the baby. This is a test which bounces harmless sound waves off the baby so your caregiver can more accurately determine due dates. Sometimes, a specialized test is done on the amniotic fluid surrounding the baby. This test is called an amniocentesis. The amniotic fluid is obtained by sticking a needle into the belly (abdomen). This is done to check the chromosomes in instances where there is a concern about possible genetic problems with the baby. It is also sometimes done near the end of pregnancy if an early delivery is required. In this case, it is done to help make sure the baby's lungs are mature enough for the baby to live outside of the womb. CHANGES OCCURING IN THE SECOND TRIMESTER OF PREGNANCY Your body goes through many changes during pregnancy. They vary from person to person. Talk to your caregiver about changes you notice that you are concerned about.  During the second trimester, you will likely have an increase in your appetite. It is normal to have cravings for certain foods. This varies from person to person and pregnancy to pregnancy.  Your lower abdomen will begin to bulge.  You may have to urinate more often because the uterus and baby are pressing on your bladder. It is also common to get more bladder infections during pregnancy (pain with urination). You can help this by   drinking lots of fluids and emptying your bladder before and after intercourse.  You may begin to get stretch marks on your hips, abdomen, and breasts. These are normal changes in the body during pregnancy. There are no exercises or medications to take that prevent this change.  You may begin to develop swollen and bulging veins (varicose veins) in your legs. Wearing support hose, elevating your feet for 15 minutes, 3 to 4 times a day and limiting salt in your diet helps lessen the problem.  Heartburn may  develop as the uterus grows and pushes up against the stomach. Antacids recommended by your caregiver helps with this problem. Also, eating smaller meals 4 to 5 times a day helps.  Constipation can be treated with a stool softener or adding bulk to your diet. Drinking lots of fluids, vegetables, fruits, and whole grains are helpful.  Exercising is also helpful. If you have been very active up until your pregnancy, most of these activities can be continued during your pregnancy. If you have been less active, it is helpful to start an exercise program such as walking.  Hemorrhoids (varicose veins in the rectum) may develop at the end of the second trimester. Warm sitz baths and hemorrhoid cream recommended by your caregiver helps hemorrhoid problems.  Backaches may develop during this time of your pregnancy. Avoid heavy lifting, wear low heal shoes and practice good posture to help with backache problems.  Some pregnant women develop tingling and numbness of their hand and fingers because of swelling and tightening of ligaments in the wrist (carpel tunnel syndrome). This goes away after the baby is born.  As your breasts enlarge, you may have to get a bigger bra. Get a comfortable, cotton, support bra. Do not get a nursing bra until the last month of the pregnancy if you will be nursing the baby.  You may get a dark line from your belly button to the pubic area called the linea nigra.  You may develop rosy cheeks because of increase blood flow to the face.  You may develop spider looking lines of the face, neck, arms and chest. These go away after the baby is born. HOME CARE INSTRUCTIONS   It is extremely important to avoid all smoking, herbs, alcohol, and unprescribed drugs during your pregnancy. These chemicals affect the formation and growth of the baby. Avoid these chemicals throughout the pregnancy to ensure the delivery of a healthy infant.  Most of your home care instructions are the same  as suggested for the first trimester of your pregnancy. Keep your caregiver's appointments. Follow your caregiver's instructions regarding medication use, exercise and diet.  During pregnancy, you are providing food for you and your baby. Continue to eat regular, well-balanced meals. Choose foods such as meat, fish, milk and other low fat dairy products, vegetables, fruits, and whole-grain breads and cereals. Your caregiver will tell you of the ideal weight gain.  A physical sexual relationship may be continued up until near the end of pregnancy if there are no other problems. Problems could include early (premature) leaking of amniotic fluid from the membranes, vaginal bleeding, abdominal pain, or other medical or pregnancy problems.  Exercise regularly if there are no restrictions. Check with your caregiver if you are unsure of the safety of some of your exercises. The greatest weight gain will occur in the last 2 trimesters of pregnancy. Exercise will help you:  Control your weight.  Get you in shape for labor and delivery.  Lose weight   after you have the baby.  Wear a good support or jogging bra for breast tenderness during pregnancy. This may help if worn during sleep. Pads or tissues may be used in the bra if you are leaking colostrum.  Do not use hot tubs, steam rooms or saunas throughout the pregnancy.  Wear your seat belt at all times when driving. This protects you and your baby if you are in an accident.  Avoid raw meat, uncooked cheese, cat litter boxes and soil used by cats. These carry germs that can cause birth defects in the baby.  The second trimester is also a good time to visit your dentist for your dental health if this has not been done yet. Getting your teeth cleaned is OK. Use a soft toothbrush. Brush gently during pregnancy.  It is easier to loose urine during pregnancy. Tightening up and strengthening the pelvic muscles will help with this problem. Practice stopping  your urination while you are going to the bathroom. These are the same muscles you need to strengthen. It is also the muscles you would use as if you were trying to stop from passing gas. You can practice tightening these muscles up 10 times a set and repeating this about 3 times per day. Once you know what muscles to tighten up, do not perform these exercises during urination. It is more likely to contribute to an infection by backing up the urine.  Ask for help if you have financial, counseling or nutritional needs during pregnancy. Your caregiver will be able to offer counseling for these needs as well as refer you for other special needs.  Your skin may become oily. If so, wash your face with mild soap, use non-greasy moisturizer and oil or cream based makeup. MEDICATIONS AND DRUG USE IN PREGNANCY  Take prenatal vitamins as directed. The vitamin should contain 1 milligram of folic acid. Keep all vitamins out of reach of children. Only a couple vitamins or tablets containing iron may be fatal to a baby or young child when ingested.  Avoid use of all medications, including herbs, over-the-counter medications, not prescribed or suggested by your caregiver. Only take over-the-counter or prescription medicines for pain, discomfort, or fever as directed by your caregiver. Do not use aspirin.  Let your caregiver also know about herbs you may be using.  Alcohol is related to a number of birth defects. This includes fetal alcohol syndrome. All alcohol, in any form, should be avoided completely. Smoking will cause low birth rate and premature babies.  Street or illegal drugs are very harmful to the baby. They are absolutely forbidden. A baby born to an addicted mother will be addicted at birth. The baby will go through the same withdrawal an adult does. SEEK MEDICAL CARE IF:  You have any concerns or worries during your pregnancy. It is better to call with your questions if you feel they cannot wait,  rather than worry about them. SEEK IMMEDIATE MEDICAL CARE IF:   An unexplained oral temperature above 102 F (38.9 C) develops, or as your caregiver suggests.  You have leaking of fluid from the vagina (birth canal). If leaking membranes are suspected, take your temperature and tell your caregiver of this when you call.  There is vaginal spotting, bleeding, or passing clots. Tell your caregiver of the amount and how many pads are used. Light spotting in pregnancy is common, especially following intercourse.  You develop a bad smelling vaginal discharge with a change in the color from clear   to white.  You continue to feel sick to your stomach (nauseated) and have no relief from remedies suggested. You vomit blood or coffee ground-like materials.  You lose more than 2 pounds of weight or gain more than 2 pounds of weight over 1 week, or as suggested by your caregiver.  You notice swelling of your face, hands, feet, or legs.  You get exposed to German measles and have never had them.  You are exposed to fifth disease or chickenpox.  You develop belly (abdominal) pain. Round ligament discomfort is a common non-cancerous (benign) cause of abdominal pain in pregnancy. Your caregiver still must evaluate you.  You develop a bad headache that does not go away.  You develop fever, diarrhea, pain with urination, or shortness of breath.  You develop visual problems, blurry, or double vision.  You fall or are in a car accident or any kind of trauma.  There is mental or physical violence at home. Document Released: 07/20/2001 Document Revised: 10/18/2011 Document Reviewed: 01/22/2009 [employer] Patient Information 2013 [employer].  Breastfeeding Deciding to breastfeed is one of the best choices you can make for you and your baby. The information that follows gives a brief overview of the benefits of breastfeeding as well as common topics surrounding breastfeeding. BENEFITS OF  BREASTFEEDING For the baby  The first milk (colostrum) helps the baby's digestive system function better.   There are antibodies in the mother's milk that help the baby fight off infections.   The baby has a lower incidence of asthma, allergies, and sudden infant death syndrome (SIDS).   The nutrients in breast milk are better for the baby than infant formulas, and breast milk helps the baby's brain grow better.   Babies who breastfeed have less gas, colic, and constipation.  For the mother  Breastfeeding helps develop a very special bond between the mother and her baby.   Breastfeeding is convenient, always available at the correct temperature, and costs nothing.   Breastfeeding burns calories in the mother and helps her lose weight that was gained during pregnancy.   Breastfeeding makes the uterus contract back down to normal size faster and slows bleeding following delivery.   Breastfeeding mothers have a lower risk of developing breast cancer.  BREASTFEEDING FREQUENCY  A healthy, full-term baby may breastfeed as often as every hour or space his or her feedings to every 3 hours.   Watch your baby for signs of hunger. Nurse your baby if he or she shows signs of hunger. How often you nurse will vary from baby to baby.   Nurse as often as the baby requests, or when you feel the need to reduce the fullness of your breasts.   Awaken the baby if it has been 3 4 hours since the last feeding.   Frequent feeding will help the mother make more milk and will help prevent problems, such as sore nipples and engorgement of the breasts.  BABY'S POSITION AT THE BREAST  Whether lying down or sitting, be sure that the baby's tummy is facing your tummy.   Support the breast with 4 fingers underneath the breast and the thumb above. Make sure your fingers are well away from the nipple and baby's mouth.   Stroke the baby's lips gently with your finger or nipple.   When the  baby's mouth is open wide enough, place all of your nipple and as much of the areola as possible into your baby's mouth.   Pull the baby in   close so the tip of the nose and the baby's cheeks touch the breast during the feeding.  FEEDINGS AND SUCTION  The length of each feeding varies from baby to baby and from feeding to feeding.   The baby must suck about 2 3 minutes for your milk to get to him or her. This is called a "let down." For this reason, allow the baby to feed on each breast as long as he or she wants. Your baby will end the feeding when he or she has received the right balance of nutrients.   To break the suction, put your finger into the corner of the baby's mouth and slide it between his or her gums before removing your breast from his or her mouth. This will help prevent sore nipples.  HOW TO TELL WHETHER YOUR BABY IS GETTING ENOUGH BREAST MILK. Wondering whether or not your baby is getting enough milk is a common concern among mothers. You can be assured that your baby is getting enough milk if:   Your baby is actively sucking and you hear swallowing.   Your baby seems relaxed and satisfied after a feeding.   Your baby nurses at least 8 12 times in a 24 hour time period. Nurse your baby until he or she unlatches or falls asleep at the first breast (at least 10 20 minutes), then offer the second side.   Your baby is wetting 5 6 disposable diapers (6 8 cloth diapers) in a 24 hour period by 5 6 days of age.   Your baby is having at least 3 4 stools every 24 hours for the first 6 weeks. The stool should be soft and yellow.   Your baby should gain 4 7 ounces per week after he or she is 4 days old.   Your breasts feel softer after nursing.  REDUCING BREAST ENGORGEMENT  In the first week after your baby is born, you may experience signs of breast engorgement. When breasts are engorged, they feel heavy, warm, full, and may be tender to the touch. You can reduce  engorgement if you:   Nurse frequently, every 2 3 hours. Mothers who breastfeed early and often have fewer problems with engorgement.   Place light ice packs on your breasts for 10 20 minutes between feedings. This reduces swelling. Wrap the ice packs in a lightweight towel to protect your skin. Bags of frozen vegetables work well for this purpose.   Take a warm shower or apply warm, moist heat to your breast for 5 10 minutes just before each feeding. This increases circulation and helps the milk flow.   Gently massage your breast before and during the feeding. Using your finger tips, massage from the chest wall towards your nipple in a circular motion.   Make sure that the baby empties at least one breast at every feeding before switching sides.   Use a breast pump to empty the breasts if your baby is sleepy or not nursing well. You may also want to pump if you are returning to work oryou feel you are getting engorged.   Avoid bottle feeds, pacifiers, or supplemental feedings of water or juice in place of breastfeeding. Breast milk is all the food your baby needs. It is not necessary for your baby to have water or formula. In fact, to help your breasts make more milk, it is best not to give your baby supplemental feedings during the early weeks.   Be sure the baby is latched   on and positioned properly while breastfeeding.   Wear a supportive bra, avoiding underwire styles.   Eat a balanced diet with enough fluids.   Rest often, relax, and take your prenatal vitamins to prevent fatigue, stress, and anemia.  If you follow these suggestions, your engorgement should improve in 24 48 hours. If you are still experiencing difficulty, call your lactation consultant or caregiver.  CARING FOR YOURSELF Take care of your breasts  Bathe or shower daily.   Avoid using soap on your nipples.   Start feedings on your left breast at one feeding and on your right breast at the next  feeding.   You will notice an increase in your milk supply 2 5 days after delivery. You may feel some discomfort from engorgement, which makes your breasts very firm and often tender. Engorgement "peaks" out within 24 48 hours. In the meantime, apply warm moist towels to your breasts for 5 10 minutes before feeding. Gentle massage and expression of some milk before feeding will soften your breasts, making it easier for your baby to latch on.   Wear a well-fitting nursing bra, and air dry your nipples for a 3 4minutes after each feeding.   Only use cotton bra pads.   Only use pure lanolin on your nipples after nursing. You do not need to wash it off before feeding the baby again. Another option is to express a few drops of breast milk and gently massage it into your nipples.  Take care of yourself  Eat well-balanced meals and nutritious snacks.   Drinking milk, fruit juice, and water to satisfy your thirst (about 8 glasses a day).   Get plenty of rest.  Avoid foods that you notice affect the baby in a bad way.  SEEK MEDICAL CARE IF:   You have difficulty with breastfeeding and need help.   You have a hard, red, sore area on your breast that is accompanied by a fever.   Your baby is too sleepy to eat well or is having trouble sleeping.   Your baby is wetting less than 6 diapers a day, by 5 days of age.   Your baby's skin or white part of his or her eyes is more yellow than it was in the hospital.   You feel depressed.  Document Released: 07/26/2005 Document Revised: 01/25/2012 Document Reviewed: 10/24/2011 [employer] Patient Information 2013 [employer].  

## 2012-09-15 LAB — OBSTETRIC PANEL
Basophils Relative: 0 % (ref 0–1)
Hemoglobin: 13.3 g/dL (ref 12.0–15.0)
Hepatitis B Surface Ag: NEGATIVE
Lymphs Abs: 1.1 10*3/uL (ref 0.7–4.0)
MCHC: 33.7 g/dL (ref 30.0–36.0)
Monocytes Relative: 6 % (ref 3–12)
Neutro Abs: 6 10*3/uL (ref 1.7–7.7)
Neutrophils Relative %: 78 % — ABNORMAL HIGH (ref 43–77)
RBC: 4.8 MIL/uL (ref 3.87–5.11)
Rh Type: POSITIVE

## 2012-09-16 LAB — CULTURE, OB URINE: Colony Count: 5000

## 2012-09-18 ENCOUNTER — Encounter: Payer: Self-pay | Admitting: *Deleted

## 2012-09-18 LAB — HEMOGLOBINOPATHY EVALUATION
Hemoglobin Other: 0 %
Hgb A2 Quant: 2.9 % (ref 2.2–3.2)
Hgb S Quant: 0 %

## 2012-09-26 ENCOUNTER — Encounter: Payer: Self-pay | Admitting: Family Medicine

## 2012-09-26 ENCOUNTER — Encounter: Payer: Self-pay | Admitting: *Deleted

## 2012-10-05 ENCOUNTER — Telehealth: Payer: Self-pay | Admitting: *Deleted

## 2012-10-05 NOTE — Telephone Encounter (Signed)
Message copied by Gerome Apley on Thu Oct 05, 2012  4:33 PM ------      Message from: Sherre Lain A      Created: Thu Oct 05, 2012  8:29 AM       Need to call patient to get her to come start her 17P. ------

## 2012-10-05 NOTE — Telephone Encounter (Signed)
Called patient with [employer] [phone] and left message we are calling to schedule an appointment - please call clinic tomorrow

## 2012-10-06 ENCOUNTER — Ambulatory Visit (INDEPENDENT_AMBULATORY_CARE_PROVIDER_SITE_OTHER): Payer: Self-pay | Admitting: *Deleted

## 2012-10-06 ENCOUNTER — Encounter: Payer: Self-pay | Admitting: *Deleted

## 2012-10-06 VITALS — BP 92/65 | HR 96 | Temp 97.5°F | Wt 127.2 lb

## 2012-10-06 MED ORDER — HYDROXYPROGESTERONE CAPROATE 250 MG/ML IM OIL
250.0000 mg | TOPICAL_OIL | Freq: Once | INTRAMUSCULAR | Status: AC
  Administered 2012-10-06: 250 mg via INTRAMUSCULAR

## 2012-10-06 NOTE — Telephone Encounter (Signed)
Patient came to clinic today and started injections.

## 2012-10-06 NOTE — Telephone Encounter (Signed)
Called patient with [employer] [phone]- and left a message we are calling to make an appointment with you - please call clinic today before 12 noon or Monday.

## 2012-10-06 NOTE — Progress Notes (Signed)
Claude showed up at window today because she had received calls from our clinic.  Patient to start 17p- explained to patient and started injections today. Used [employer] [phone]. Patient understands will get shots weekly on Mondays starting with next appt 10/16/12

## 2012-10-06 NOTE — Telephone Encounter (Signed)
Letter sent to patient.

## 2012-10-16 ENCOUNTER — Other Ambulatory Visit: Payer: Self-pay | Admitting: Obstetrics & Gynecology

## 2012-10-16 ENCOUNTER — Ambulatory Visit (INDEPENDENT_AMBULATORY_CARE_PROVIDER_SITE_OTHER): Payer: Medicaid Other | Admitting: Family Medicine

## 2012-10-16 VITALS — BP 94/68 | Temp 97.0°F | Wt 133.0 lb

## 2012-10-16 DIAGNOSIS — O09212 Supervision of pregnancy with history of pre-term labor, second trimester: Secondary | ICD-10-CM

## 2012-10-16 LAB — POCT URINALYSIS DIP (DEVICE)
Hgb urine dipstick: NEGATIVE
Ketones, ur: NEGATIVE mg/dL
Protein, ur: NEGATIVE mg/dL
Specific Gravity, Urine: 1.025 (ref 1.005–1.030)
Urobilinogen, UA: 0.2 mg/dL (ref 0.0–1.0)
pH: 6.5 (ref 5.0–8.0)

## 2012-10-16 MED ORDER — HYDROXYPROGESTERONE CAPROATE 250 MG/ML IM OIL
250.0000 mg | TOPICAL_OIL | Freq: Once | INTRAMUSCULAR | Status: AC
  Administered 2012-10-16: 250 mg via INTRAMUSCULAR

## 2012-10-16 NOTE — Patient Instructions (Signed)
Pregnancy - Second Trimester The second trimester of pregnancy (3 to 6 months) is a period of rapid growth for you and your baby. At the end of the sixth month, your baby is about 9 inches long and weighs 1 1/2 pounds. You will begin to feel the baby move between 18 and 20 weeks of the pregnancy. This is called quickening. Weight gain is faster. A clear fluid (colostrum) may leak out of your breasts. You may feel small contractions of the womb (uterus). This is known as false labor or Braxton-Hicks contractions. This is like a practice for labor when the baby is ready to be born. Usually, the problems with morning sickness have usually passed by the end of your first trimester. Some women develop small dark blotches (called cholasma, mask of pregnancy) on their face that usually goes away after the baby is born. Exposure to the sun makes the blotches worse. Acne may also develop in some pregnant women and pregnant women who have acne, may find that it goes away. PRENATAL EXAMS  Blood work may continue to be done during prenatal exams. These tests are done to check on your health and the probable health of your baby. Blood work is used to follow your blood levels (hemoglobin). Anemia (low hemoglobin) is common during pregnancy. Iron and vitamins are given to help prevent this. You will also be checked for diabetes between 24 and 28 weeks of the pregnancy. Some of the previous blood tests may be repeated.  The size of the uterus is measured during each visit. This is to make sure that the baby is continuing to grow properly according to the dates of the pregnancy.  Your blood pressure is checked every prenatal visit. This is to make sure you are not getting toxemia.  Your urine is checked to make sure you do not have an infection, diabetes or protein in the urine.  Your weight is checked often to make sure gains are happening at the suggested rate. This is to ensure that both you and your baby are  growing normally.  Sometimes, an ultrasound is performed to confirm the proper growth and development of the baby. This is a test which bounces harmless sound waves off the baby so your caregiver can more accurately determine due dates. Sometimes, a specialized test is done on the amniotic fluid surrounding the baby. This test is called an amniocentesis. The amniotic fluid is obtained by sticking a needle into the belly (abdomen). This is done to check the chromosomes in instances where there is a concern about possible genetic problems with the baby. It is also sometimes done near the end of pregnancy if an early delivery is required. In this case, it is done to help make sure the baby's lungs are mature enough for the baby to live outside of the womb. CHANGES OCCURING IN THE SECOND TRIMESTER OF PREGNANCY Your body goes through many changes during pregnancy. They vary from person to person. Talk to your caregiver about changes you notice that you are concerned about.  During the second trimester, you will likely have an increase in your appetite. It is normal to have cravings for certain foods. This varies from person to person and pregnancy to pregnancy.  Your lower abdomen will begin to bulge.  You may have to urinate more often because the uterus and baby are pressing on your bladder. It is also common to get more bladder infections during pregnancy (pain with urination). You can help this by   drinking lots of fluids and emptying your bladder before and after intercourse.  You may begin to get stretch marks on your hips, abdomen, and breasts. These are normal changes in the body during pregnancy. There are no exercises or medications to take that prevent this change.  You may begin to develop swollen and bulging veins (varicose veins) in your legs. Wearing support hose, elevating your feet for 15 minutes, 3 to 4 times a day and limiting salt in your diet helps lessen the problem.  Heartburn may  develop as the uterus grows and pushes up against the stomach. Antacids recommended by your caregiver helps with this problem. Also, eating smaller meals 4 to 5 times a day helps.  Constipation can be treated with a stool softener or adding bulk to your diet. Drinking lots of fluids, vegetables, fruits, and whole grains are helpful.  Exercising is also helpful. If you have been very active up until your pregnancy, most of these activities can be continued during your pregnancy. If you have been less active, it is helpful to start an exercise program such as walking.  Hemorrhoids (varicose veins in the rectum) may develop at the end of the second trimester. Warm sitz baths and hemorrhoid cream recommended by your caregiver helps hemorrhoid problems.  Backaches may develop during this time of your pregnancy. Avoid heavy lifting, wear low heal shoes and practice good posture to help with backache problems.  Some pregnant women develop tingling and numbness of their hand and fingers because of swelling and tightening of ligaments in the wrist (carpel tunnel syndrome). This goes away after the baby is born.  As your breasts enlarge, you may have to get a bigger bra. Get a comfortable, cotton, support bra. Do not get a nursing bra until the last month of the pregnancy if you will be nursing the baby.  You may get a dark line from your belly button to the pubic area called the linea nigra.  You may develop rosy cheeks because of increase blood flow to the face.  You may develop spider looking lines of the face, neck, arms and chest. These go away after the baby is born. HOME CARE INSTRUCTIONS   It is extremely important to avoid all smoking, herbs, alcohol, and unprescribed drugs during your pregnancy. These chemicals affect the formation and growth of the baby. Avoid these chemicals throughout the pregnancy to ensure the delivery of a healthy infant.  Most of your home care instructions are the same  as suggested for the first trimester of your pregnancy. Keep your caregiver's appointments. Follow your caregiver's instructions regarding medication use, exercise and diet.  During pregnancy, you are providing food for you and your baby. Continue to eat regular, well-balanced meals. Choose foods such as meat, fish, milk and other low fat dairy products, vegetables, fruits, and whole-grain breads and cereals. Your caregiver will tell you of the ideal weight gain.  A physical sexual relationship may be continued up until near the end of pregnancy if there are no other problems. Problems could include early (premature) leaking of amniotic fluid from the membranes, vaginal bleeding, abdominal pain, or other medical or pregnancy problems.  Exercise regularly if there are no restrictions. Check with your caregiver if you are unsure of the safety of some of your exercises. The greatest weight gain will occur in the last 2 trimesters of pregnancy. Exercise will help you:  Control your weight.  Get you in shape for labor and delivery.  Lose weight   after you have the baby.  Wear a good support or jogging bra for breast tenderness during pregnancy. This may help if worn during sleep. Pads or tissues may be used in the bra if you are leaking colostrum.  Do not use hot tubs, steam rooms or saunas throughout the pregnancy.  Wear your seat belt at all times when driving. This protects you and your baby if you are in an accident.  Avoid raw meat, uncooked cheese, cat litter boxes and soil used by cats. These carry germs that can cause birth defects in the baby.  The second trimester is also a good time to visit your dentist for your dental health if this has not been done yet. Getting your teeth cleaned is OK. Use a soft toothbrush. Brush gently during pregnancy.  It is easier to loose urine during pregnancy. Tightening up and strengthening the pelvic muscles will help with this problem. Practice stopping  your urination while you are going to the bathroom. These are the same muscles you need to strengthen. It is also the muscles you would use as if you were trying to stop from passing gas. You can practice tightening these muscles up 10 times a set and repeating this about 3 times per day. Once you know what muscles to tighten up, do not perform these exercises during urination. It is more likely to contribute to an infection by backing up the urine.  Ask for help if you have financial, counseling or nutritional needs during pregnancy. Your caregiver will be able to offer counseling for these needs as well as refer you for other special needs.  Your skin may become oily. If so, wash your face with mild soap, use non-greasy moisturizer and oil or cream based makeup. MEDICATIONS AND DRUG USE IN PREGNANCY  Take prenatal vitamins as directed. The vitamin should contain 1 milligram of folic acid. Keep all vitamins out of reach of children. Only a couple vitamins or tablets containing iron may be fatal to a baby or young child when ingested.  Avoid use of all medications, including herbs, over-the-counter medications, not prescribed or suggested by your caregiver. Only take over-the-counter or prescription medicines for pain, discomfort, or fever as directed by your caregiver. Do not use aspirin.  Let your caregiver also know about herbs you may be using.  Alcohol is related to a number of birth defects. This includes fetal alcohol syndrome. All alcohol, in any form, should be avoided completely. Smoking will cause low birth rate and premature babies.  Street or illegal drugs are very harmful to the baby. They are absolutely forbidden. A baby born to an addicted mother will be addicted at birth. The baby will go through the same withdrawal an adult does. SEEK MEDICAL CARE IF:  You have any concerns or worries during your pregnancy. It is better to call with your questions if you feel they cannot wait,  rather than worry about them. SEEK IMMEDIATE MEDICAL CARE IF:   An unexplained oral temperature above 102 F (38.9 C) develops, or as your caregiver suggests.  You have leaking of fluid from the vagina (birth canal). If leaking membranes are suspected, take your temperature and tell your caregiver of this when you call.  There is vaginal spotting, bleeding, or passing clots. Tell your caregiver of the amount and how many pads are used. Light spotting in pregnancy is common, especially following intercourse.  You develop a bad smelling vaginal discharge with a change in the color from clear   to white.  You continue to feel sick to your stomach (nauseated) and have no relief from remedies suggested. You vomit blood or coffee ground-like materials.  You lose more than 2 pounds of weight or gain more than 2 pounds of weight over 1 week, or as suggested by your caregiver.  You notice swelling of your face, hands, feet, or legs.  You get exposed to German measles and have never had them.  You are exposed to fifth disease or chickenpox.  You develop belly (abdominal) pain. Round ligament discomfort is a common non-cancerous (benign) cause of abdominal pain in pregnancy. Your caregiver still must evaluate you.  You develop a bad headache that does not go away.  You develop fever, diarrhea, pain with urination, or shortness of breath.  You develop visual problems, blurry, or double vision.  You fall or are in a car accident or any kind of trauma.  There is mental or physical violence at home. Document Released: 07/20/2001 Document Revised: 10/18/2011 Document Reviewed: 01/22/2009 [employer] Patient Information 2013 [employer].  Breastfeeding Deciding to breastfeed is one of the best choices you can make for you and your baby. The information that follows gives a brief overview of the benefits of breastfeeding as well as common topics surrounding breastfeeding. BENEFITS OF  BREASTFEEDING For the baby  The first milk (colostrum) helps the baby's digestive system function better.   There are antibodies in the mother's milk that help the baby fight off infections.   The baby has a lower incidence of asthma, allergies, and sudden infant death syndrome (SIDS).   The nutrients in breast milk are better for the baby than infant formulas, and breast milk helps the baby's brain grow better.   Babies who breastfeed have less gas, colic, and constipation.  For the mother  Breastfeeding helps develop a very special bond between the mother and her baby.   Breastfeeding is convenient, always available at the correct temperature, and costs nothing.   Breastfeeding burns calories in the mother and helps her lose weight that was gained during pregnancy.   Breastfeeding makes the uterus contract back down to normal size faster and slows bleeding following delivery.   Breastfeeding mothers have a lower risk of developing breast cancer.  BREASTFEEDING FREQUENCY  A healthy, full-term baby may breastfeed as often as every hour or space his or her feedings to every 3 hours.   Watch your baby for signs of hunger. Nurse your baby if he or she shows signs of hunger. How often you nurse will vary from baby to baby.   Nurse as often as the baby requests, or when you feel the need to reduce the fullness of your breasts.   Awaken the baby if it has been 3 4 hours since the last feeding.   Frequent feeding will help the mother make more milk and will help prevent problems, such as sore nipples and engorgement of the breasts.  BABY'S POSITION AT THE BREAST  Whether lying down or sitting, be sure that the baby's tummy is facing your tummy.   Support the breast with 4 fingers underneath the breast and the thumb above. Make sure your fingers are well away from the nipple and baby's mouth.   Stroke the baby's lips gently with your finger or nipple.   When the  baby's mouth is open wide enough, place all of your nipple and as much of the areola as possible into your baby's mouth.   Pull the baby in   close so the tip of the nose and the baby's cheeks touch the breast during the feeding.  FEEDINGS AND SUCTION  The length of each feeding varies from baby to baby and from feeding to feeding.   The baby must suck about 2 3 minutes for your milk to get to him or her. This is called a "let down." For this reason, allow the baby to feed on each breast as long as he or she wants. Your baby will end the feeding when he or she has received the right balance of nutrients.   To break the suction, put your finger into the corner of the baby's mouth and slide it between his or her gums before removing your breast from his or her mouth. This will help prevent sore nipples.  HOW TO TELL WHETHER YOUR BABY IS GETTING ENOUGH BREAST MILK. Wondering whether or not your baby is getting enough milk is a common concern among mothers. You can be assured that your baby is getting enough milk if:   Your baby is actively sucking and you hear swallowing.   Your baby seems relaxed and satisfied after a feeding.   Your baby nurses at least 8 12 times in a 24 hour time period. Nurse your baby until he or she unlatches or falls asleep at the first breast (at least 10 20 minutes), then offer the second side.   Your baby is wetting 5 6 disposable diapers (6 8 cloth diapers) in a 24 hour period by 5 6 days of age.   Your baby is having at least 3 4 stools every 24 hours for the first 6 weeks. The stool should be soft and yellow.   Your baby should gain 4 7 ounces per week after he or she is 4 days old.   Your breasts feel softer after nursing.  REDUCING BREAST ENGORGEMENT  In the first week after your baby is born, you may experience signs of breast engorgement. When breasts are engorged, they feel heavy, warm, full, and may be tender to the touch. You can reduce  engorgement if you:   Nurse frequently, every 2 3 hours. Mothers who breastfeed early and often have fewer problems with engorgement.   Place light ice packs on your breasts for 10 20 minutes between feedings. This reduces swelling. Wrap the ice packs in a lightweight towel to protect your skin. Bags of frozen vegetables work well for this purpose.   Take a warm shower or apply warm, moist heat to your breast for 5 10 minutes just before each feeding. This increases circulation and helps the milk flow.   Gently massage your breast before and during the feeding. Using your finger tips, massage from the chest wall towards your nipple in a circular motion.   Make sure that the baby empties at least one breast at every feeding before switching sides.   Use a breast pump to empty the breasts if your baby is sleepy or not nursing well. You may also want to pump if you are returning to work oryou feel you are getting engorged.   Avoid bottle feeds, pacifiers, or supplemental feedings of water or juice in place of breastfeeding. Breast milk is all the food your baby needs. It is not necessary for your baby to have water or formula. In fact, to help your breasts make more milk, it is best not to give your baby supplemental feedings during the early weeks.   Be sure the baby is latched   on and positioned properly while breastfeeding.   Wear a supportive bra, avoiding underwire styles.   Eat a balanced diet with enough fluids.   Rest often, relax, and take your prenatal vitamins to prevent fatigue, stress, and anemia.  If you follow these suggestions, your engorgement should improve in 24 48 hours. If you are still experiencing difficulty, call your lactation consultant or caregiver.  CARING FOR YOURSELF Take care of your breasts  Bathe or shower daily.   Avoid using soap on your nipples.   Start feedings on your left breast at one feeding and on your right breast at the next  feeding.   You will notice an increase in your milk supply 2 5 days after delivery. You may feel some discomfort from engorgement, which makes your breasts very firm and often tender. Engorgement "peaks" out within 24 48 hours. In the meantime, apply warm moist towels to your breasts for 5 10 minutes before feeding. Gentle massage and expression of some milk before feeding will soften your breasts, making it easier for your baby to latch on.   Wear a well-fitting nursing bra, and air dry your nipples for a 3 4minutes after each feeding.   Only use cotton bra pads.   Only use pure lanolin on your nipples after nursing. You do not need to wash it off before feeding the baby again. Another option is to express a few drops of breast milk and gently massage it into your nipples.  Take care of yourself  Eat well-balanced meals and nutritious snacks.   Drinking milk, fruit juice, and water to satisfy your thirst (about 8 glasses a day).   Get plenty of rest.  Avoid foods that you notice affect the baby in a bad way.  SEEK MEDICAL CARE IF:   You have difficulty with breastfeeding and need help.   You have a hard, red, sore area on your breast that is accompanied by a fever.   Your baby is too sleepy to eat well or is having trouble sleeping.   Your baby is wetting less than 6 diapers a day, by 5 days of age.   Your baby's skin or white part of his or her eyes is more yellow than it was in the hospital.   You feel depressed.  Document Released: 07/26/2005 Document Revised: 01/25/2012 Document Reviewed: 10/24/2011 [employer] Patient Information 2013 [employer].  

## 2012-10-16 NOTE — Assessment & Plan Note (Signed)
Continue 17P

## 2012-10-16 NOTE — Progress Notes (Signed)
Needs to continue 17P. Arrange for U/S--anatomy was not done.

## 2012-10-16 NOTE — Progress Notes (Signed)
Pulse   States there have been times that her face swells up.  States one time last week she noticed a scant vaginal bleeding.

## 2012-10-19 ENCOUNTER — Ambulatory Visit (HOSPITAL_COMMUNITY): Admission: RE | Admit: 2012-10-19 | Payer: Medicaid Other | Source: Ambulatory Visit

## 2012-10-19 ENCOUNTER — Other Ambulatory Visit: Payer: Self-pay | Admitting: Family Medicine

## 2012-10-19 ENCOUNTER — Ambulatory Visit (HOSPITAL_COMMUNITY)
Admission: RE | Admit: 2012-10-19 | Discharge: 2012-10-19 | Disposition: A | Discharge status: Home / Self Care | Payer: Medicaid Other | Source: Ambulatory Visit | Attending: Family Medicine | Admitting: Family Medicine

## 2012-10-19 VITALS — BP 100/68 | HR 93 | Wt 134.0 lb

## 2012-10-19 DIAGNOSIS — O358XX0 Maternal care for other (suspected) fetal abnormality and damage, not applicable or unspecified: Secondary | ICD-10-CM | POA: Insufficient documentation

## 2012-10-19 DIAGNOSIS — O0990 Supervision of high risk pregnancy, unspecified, unspecified trimester: Secondary | ICD-10-CM

## 2012-10-19 DIAGNOSIS — Z363 Encounter for antenatal screening for malformations: Secondary | ICD-10-CM | POA: Insufficient documentation

## 2012-10-19 DIAGNOSIS — O09299 Supervision of pregnancy with other poor reproductive or obstetric history, unspecified trimester: Secondary | ICD-10-CM | POA: Insufficient documentation

## 2012-10-19 DIAGNOSIS — Z23 Encounter for immunization: Secondary | ICD-10-CM

## 2012-10-19 DIAGNOSIS — Z8751 Personal history of pre-term labor: Secondary | ICD-10-CM | POA: Insufficient documentation

## 2012-10-19 NOTE — Progress Notes (Signed)
[name] [name]  was seen today for an ultrasound appointment.  See full report in [facility].  Comments: [name] was seen today due to a prior preterm delivery at 31 weeks as well as a prior 24 week IUFD in 2012.  At the time of diagnosis, the fetus had ascities.  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 The serology does not appear to have been sent to a specialty laboratory.  An interpreter was not available for a full consultation which was rescheduled at a future date.  Impression: Single IUP at 23 0/7 weeks History of preterm delivery at 31 weeks, history of IUFD at 24 weeks possibly due to toxo Normal detailed fetal anatomy Fetal growth is appropriate (59th %tile) Normal amniotic fluid volume  Recommendations: Recommend 17-P injections due to history of previous preterm delivery Patient will follow up for full MFM consultation within the next 1-2 weeks once an interpreter is available Recommend follow-up ultrasound examination in 6 weeks for interval growth.  Alpha Gula, MD

## 2012-10-23 ENCOUNTER — Ambulatory Visit: Payer: Medicaid Other

## 2012-10-23 DIAGNOSIS — Z8751 Personal history of pre-term labor: Secondary | ICD-10-CM

## 2012-10-23 MED ORDER — HYDROXYPROGESTERONE CAPROATE 250 MG/ML IM OIL
250.0000 mg | TOPICAL_OIL | Freq: Once | INTRAMUSCULAR | Status: AC
  Administered 2012-10-23: 250 mg via INTRAMUSCULAR

## 2012-10-24 ENCOUNTER — Ambulatory Visit (HOSPITAL_COMMUNITY)
Admission: RE | Admit: 2012-10-24 | Discharge: 2012-10-24 | Disposition: A | Discharge status: Home / Self Care | Payer: Medicaid Other | Source: Ambulatory Visit | Attending: Obstetrics and Gynecology | Admitting: Obstetrics and Gynecology

## 2012-10-24 VITALS — BP 99/63 | HR 102 | Wt 136.0 lb

## 2012-10-24 DIAGNOSIS — O09299 Supervision of pregnancy with other poor reproductive or obstetric history, unspecified trimester: Secondary | ICD-10-CM | POA: Insufficient documentation

## 2012-10-24 DIAGNOSIS — O99891 Other specified diseases and conditions complicating pregnancy: Secondary | ICD-10-CM | POA: Insufficient documentation

## 2012-10-24 NOTE — Consult Note (Signed)
Maternal Fetal Medicine Consultation  Requesting Provider(s): Catalina Antigua, MD  Reason for consultation: Hx of preterm delivery at 31 weeks, IUFD at 24 weeks  HPI: [name] [name] is a 27 yo G3P0201 currently at 22 5/7 weeks who was seen for consultation today due to a history of a 24 week fetal demise as well as a previous preterm delivery at 31 weeks.  The patient reports that she presented for her routine clinic visit at 24 weeks and was noted to have an IUFD.  Ultrasound examination at the time of diagnosis revealed fetal ascites.  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  Unfortunately, confirmatory testing was not performed at a reference lab.  Autopsy was not performed.  Placental pathology revealed mild chorioamnionitis but was otherwise within normal limits.  The remainder of the work up for the IUFD was negative.  [name] [name] first delivery was a spontaneous preterm delivery at 54 weeks in [place].  Her currently pregnancy is uncomplicated.  She is currently getting weekly 17-P injections.  She is without complaints today.  OB History: OB History   Grav Para Term Preterm Abortions TAB SAB Ect Mult Living   3 2 0 2 0 0 0 0 0 1     1) 31 week preterm vaginal delivery 2) 23 week IUFD  PMH:  Past Medical History  Diagnosis Date  . No pertinent past medical history     PSH:  Past Surgical History  Procedure Laterality Date  . No past surgeries     Meds:  17-P injections, prenatal vitamins  Allergies: No Known Allergies  FH: denies family history of birth defects and hereditary disorders  Soc: denies tobacco, ETOH and illicit drug use  Review of Systems: no vaginal bleeding or cramping/contractions, no LOF, no nausea/vomiting. All other systems reviewed and are negative.   PE:   Filed Vitals:   10/24/12 0904  BP: 99/63  Pulse: 102    GEN: well-appearing female ABD: gravid, NT   A/P: 1) Single IUP at 23 5/7 weeks         2)  History of prior preterm delivery at 31 weeks  - concur with 17-P injections.  Preterm labor precautions given.         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 [facility]) it is difficult to determine whether or not this was the cause of the prior IUFD.  Toxoplasmosis IgM may stay positive for a median time of 10-13 months so therefore does not necessarily mean that a recent infection occurred during the course of the pregnancy.  Even with a very elevated toxoplasmosis IgG, it is difficult to determine when the infection actually occurred.Typical ultrasound findings associated with fetal toxoplasmosis infection include cerebral ventriculomegaly, cerebral calcifications and liver hyperechoic densities.  Less common findings include ascites, pleural/ pericardial effusions and thickened placenta.  Fetal demise is actually a rare occurrence with fetal toxoplasmosis infection.  Fortunately, congenital toxoplasmosis due to reinfection is extremely rare - has only been reported in atypical toxoplasmosis strains.  Recommendations: 1) Since the etiology of the prior IUFD is still in question - ordered antiphospholipid antibodies (anticardiolipin, lupus anticoagulant and beta 2 microglobulin) 2) Recommend serial growth scans - patient is scheduled to return in 6 weeks for interval growth 3) Would consider delivery by EDD given the patient's history 4) Continue 17-P injections as  written.      Thank you for the opportunity to be a part of the care of [name] [name]. Please contact our office if we can be of further assistance.   I spent approximately 30 minutes with this patient with over 50% of time spent in face-to-face counseling.  Alpha Gula, MD Maternal Fetal Medicine

## 2012-10-25 LAB — LUPUS ANTICOAGULANT PANEL
DRVVT: 46.1 secs — ABNORMAL HIGH (ref <42.9)
Lupus Anticoagulant: NOT DETECTED
dRVVT Incubated 1:1 Mix: 38.3 secs (ref <42.9)

## 2012-10-25 NOTE — Addendum Note (Signed)
Encounter addended by: Ty Hilts, RN on: 10/25/2012 11:48 AM<BR>     Documentation filed: Charges VN

## 2012-10-30 ENCOUNTER — Other Ambulatory Visit: Payer: Self-pay | Admitting: Obstetrics and Gynecology

## 2012-10-30 ENCOUNTER — Ambulatory Visit (INDEPENDENT_AMBULATORY_CARE_PROVIDER_SITE_OTHER): Payer: Medicaid Other | Admitting: Obstetrics and Gynecology

## 2012-10-30 ENCOUNTER — Encounter: Payer: Self-pay | Admitting: Obstetrics and Gynecology

## 2012-10-30 VITALS — BP 103/75 | Temp 98.4°F | Wt 138.0 lb

## 2012-10-30 DIAGNOSIS — O09299 Supervision of pregnancy with other poor reproductive or obstetric history, unspecified trimester: Secondary | ICD-10-CM

## 2012-10-30 DIAGNOSIS — O0992 Supervision of high risk pregnancy, unspecified, second trimester: Secondary | ICD-10-CM

## 2012-10-30 DIAGNOSIS — O09212 Supervision of pregnancy with history of pre-term labor, second trimester: Secondary | ICD-10-CM

## 2012-10-30 DIAGNOSIS — O09219 Supervision of pregnancy with history of pre-term labor, unspecified trimester: Secondary | ICD-10-CM

## 2012-10-30 DIAGNOSIS — Z8759 Personal history of other complications of pregnancy, childbirth and the puerperium: Secondary | ICD-10-CM

## 2012-10-30 DIAGNOSIS — O4410 Placenta previa with hemorrhage, unspecified trimester: Secondary | ICD-10-CM

## 2012-10-30 DIAGNOSIS — O4412 Placenta previa with hemorrhage, second trimester: Secondary | ICD-10-CM

## 2012-10-30 LAB — POCT URINALYSIS DIP (DEVICE)
Bilirubin Urine: NEGATIVE
Glucose, UA: NEGATIVE mg/dL
Hgb urine dipstick: NEGATIVE
Ketones, ur: NEGATIVE mg/dL
Specific Gravity, Urine: 1.02 (ref 1.005–1.030)

## 2012-10-30 MED ORDER — HYDROXYPROGESTERONE CAPROATE 250 MG/ML IM OIL
250.0000 mg | TOPICAL_OIL | Freq: Once | INTRAMUSCULAR | Status: AC
  Administered 2012-10-30: 250 mg via INTRAMUSCULAR

## 2012-10-30 NOTE — Addendum Note (Signed)
Addended by: Kathee Delton on: 10/30/2012 11:42 AM   Modules accepted: Orders

## 2012-10-30 NOTE — Progress Notes (Signed)
Pulse 105  SpO2 97% C/o dizziness; states she's been having intermittent chest pain episodes. C/o pressure on right flank.

## 2012-10-30 NOTE — Progress Notes (Signed)
Patient doing well without any complaints. Fm/PTL precautions reviewed. F/U MFM ultrasound on 4/24. Continue weekly 17-P

## 2012-11-06 ENCOUNTER — Ambulatory Visit (INDEPENDENT_AMBULATORY_CARE_PROVIDER_SITE_OTHER): Payer: Medicaid Other | Admitting: *Deleted

## 2012-11-06 VITALS — BP 104/71 | HR 100 | Temp 98.8°F | Wt 140.8 lb

## 2012-11-06 DIAGNOSIS — O09219 Supervision of pregnancy with history of pre-term labor, unspecified trimester: Secondary | ICD-10-CM

## 2012-11-06 DIAGNOSIS — O09212 Supervision of pregnancy with history of pre-term labor, second trimester: Secondary | ICD-10-CM

## 2012-11-06 MED ORDER — HYDROXYPROGESTERONE CAPROATE 250 MG/ML IM OIL
250.0000 mg | TOPICAL_OIL | Freq: Once | INTRAMUSCULAR | Status: AC
  Administered 2012-11-06: 250 mg via INTRAMUSCULAR

## 2012-11-13 ENCOUNTER — Other Ambulatory Visit: Payer: Self-pay | Admitting: Obstetrics and Gynecology

## 2012-11-13 ENCOUNTER — Ambulatory Visit (INDEPENDENT_AMBULATORY_CARE_PROVIDER_SITE_OTHER): Payer: Medicaid Other | Admitting: Family Medicine

## 2012-11-13 VITALS — BP 112/77 | Wt 140.3 lb

## 2012-11-13 DIAGNOSIS — O09213 Supervision of pregnancy with history of pre-term labor, third trimester: Secondary | ICD-10-CM

## 2012-11-13 DIAGNOSIS — Z8759 Personal history of other complications of pregnancy, childbirth and the puerperium: Secondary | ICD-10-CM

## 2012-11-13 DIAGNOSIS — O09219 Supervision of pregnancy with history of pre-term labor, unspecified trimester: Secondary | ICD-10-CM

## 2012-11-13 DIAGNOSIS — O09299 Supervision of pregnancy with other poor reproductive or obstetric history, unspecified trimester: Secondary | ICD-10-CM

## 2012-11-13 LAB — POCT URINALYSIS DIP (DEVICE)
Bilirubin Urine: NEGATIVE
Hgb urine dipstick: NEGATIVE
Ketones, ur: NEGATIVE mg/dL
Specific Gravity, Urine: 1.015 (ref 1.005–1.030)
pH: 6.5 (ref 5.0–8.0)

## 2012-11-13 MED ORDER — HYDROXYPROGESTERONE CAPROATE 250 MG/ML IM OIL
250.0000 mg | TOPICAL_OIL | Freq: Once | INTRAMUSCULAR | Status: AC
  Administered 2012-11-13: 250 mg via INTRAMUSCULAR

## 2012-11-13 NOTE — Progress Notes (Signed)
Doing well--Has seen MFM and they are following.  Weekly 17P 28 wk labs next visit.

## 2012-11-13 NOTE — Progress Notes (Signed)
Pulse: 104

## 2012-11-13 NOTE — Patient Instructions (Signed)
Breastfeeding Deciding to breastfeed is one of the best choices you can make for you and your baby. The information that follows gives a brief overview of the benefits of breastfeeding as well as common topics surrounding breastfeeding. BENEFITS OF BREASTFEEDING For the baby  The first milk (colostrum) helps the baby's digestive system function better.   There are antibodies in the mother's milk that help the baby fight off infections.   The baby has a lower incidence of asthma, allergies, and sudden infant death syndrome (SIDS).   The nutrients in breast milk are better for the baby than infant formulas, and breast milk helps the baby's brain grow better.   Babies who breastfeed have less gas, colic, and constipation.  For the mother  Breastfeeding helps develop a very special bond between the mother and her baby.   Breastfeeding is convenient, always available at the correct temperature, and costs nothing.   Breastfeeding burns calories in the mother and helps her lose weight that was gained during pregnancy.   Breastfeeding makes the uterus contract back down to normal size faster and slows bleeding following delivery.   Breastfeeding mothers have a lower risk of developing breast cancer.  BREASTFEEDING FREQUENCY  A healthy, full-term baby may breastfeed as often as every hour or space his or her feedings to every 3 hours.   Watch your baby for signs of hunger. Nurse your baby if he or she shows signs of hunger. How often you nurse will vary from baby to baby.   Nurse as often as the baby requests, or when you feel the need to reduce the fullness of your breasts.   Awaken the baby if it has been 3 4 hours since the last feeding.   Frequent feeding will help the mother make more milk and will help prevent problems, such as sore nipples and engorgement of the breasts.  BABY'S POSITION AT THE BREAST  Whether lying down or sitting, be sure that the baby's tummy is  facing your tummy.   Support the breast with 4 fingers underneath the breast and the thumb above. Make sure your fingers are well away from the nipple and baby's mouth.   Stroke the baby's lips gently with your finger or nipple.   When the baby's mouth is open wide enough, place all of your nipple and as much of the areola as possible into your baby's mouth.   Pull the baby in close so the tip of the nose and the baby's cheeks touch the breast during the feeding.  FEEDINGS AND SUCTION  The length of each feeding varies from baby to baby and from feeding to feeding.   The baby must suck about 2 3 minutes for your milk to get to him or her. This is called a "let down." For this reason, allow the baby to feed on each breast as long as he or she wants. Your baby will end the feeding when he or she has received the right balance of nutrients.   To break the suction, put your finger into the corner of the baby's mouth and slide it between his or her gums before removing your breast from his or her mouth. This will help prevent sore nipples.  HOW TO TELL WHETHER YOUR BABY IS GETTING ENOUGH BREAST MILK. Wondering whether or not your baby is getting enough milk is a common concern among mothers. You can be assured that your baby is getting enough milk if:   Your baby is actively   sucking and you hear swallowing.   Your baby seems relaxed and satisfied after a feeding.   Your baby nurses at least 8 12 times in a 24 hour time period. Nurse your baby until he or she unlatches or falls asleep at the first breast (at least 10 20 minutes), then offer the second side.   Your baby is wetting 5 6 disposable diapers (6 8 cloth diapers) in a 24 hour period by 5 6 days of age.   Your baby is having at least 3 4 stools every 24 hours for the first 6 weeks. The stool should be soft and yellow.   Your baby should gain 4 7 ounces per week after he or she is 4 days old.   Your breasts feel softer  after nursing.  REDUCING BREAST ENGORGEMENT  In the first week after your baby is born, you may experience signs of breast engorgement. When breasts are engorged, they feel heavy, warm, full, and may be tender to the touch. You can reduce engorgement if you:   Nurse frequently, every 2 3 hours. Mothers who breastfeed early and often have fewer problems with engorgement.   Place light ice packs on your breasts for 10 20 minutes between feedings. This reduces swelling. Wrap the ice packs in a lightweight towel to protect your skin. Bags of frozen vegetables work well for this purpose.   Take a warm shower or apply warm, moist heat to your breast for 5 10 minutes just before each feeding. This increases circulation and helps the milk flow.   Gently massage your breast before and during the feeding. Using your finger tips, massage from the chest wall towards your nipple in a circular motion.   Make sure that the baby empties at least one breast at every feeding before switching sides.   Use a breast pump to empty the breasts if your baby is sleepy or not nursing well. You may also want to pump if you are returning to work oryou feel you are getting engorged.   Avoid bottle feeds, pacifiers, or supplemental feedings of water or juice in place of breastfeeding. Breast milk is all the food your baby needs. It is not necessary for your baby to have water or formula. In fact, to help your breasts make more milk, it is best not to give your baby supplemental feedings during the early weeks.   Be sure the baby is latched on and positioned properly while breastfeeding.   Wear a supportive bra, avoiding underwire styles.   Eat a balanced diet with enough fluids.   Rest often, relax, and take your prenatal vitamins to prevent fatigue, stress, and anemia.  If you follow these suggestions, your engorgement should improve in 24 48 hours. If you are still experiencing difficulty, call your  lactation consultant or caregiver.  CARING FOR YOURSELF Take care of your breasts  Bathe or shower daily.   Avoid using soap on your nipples.   Start feedings on your left breast at one feeding and on your right breast at the next feeding.   You will notice an increase in your milk supply 2 5 days after delivery. You may feel some discomfort from engorgement, which makes your breasts very firm and often tender. Engorgement "peaks" out within 24 48 hours. In the meantime, apply warm moist towels to your breasts for 5 10 minutes before feeding. Gentle massage and expression of some milk before feeding will soften your breasts, making it easier for your   baby to latch on.   Wear a well-fitting nursing bra, and air dry your nipples for a 3 4minutes after each feeding.   Only use cotton bra pads.   Only use pure lanolin on your nipples after nursing. You do not need to wash it off before feeding the baby again. Another option is to express a few drops of breast milk and gently massage it into your nipples.  Take care of yourself  Eat well-balanced meals and nutritious snacks.   Drinking milk, fruit juice, and water to satisfy your thirst (about 8 glasses a day).   Get plenty of rest.  Avoid foods that you notice affect the baby in a bad way.  SEEK MEDICAL CARE IF:   You have difficulty with breastfeeding and need help.   You have a hard, red, sore area on your breast that is accompanied by a fever.   Your baby is too sleepy to eat well or is having trouble sleeping.   Your baby is wetting less than 6 diapers a day, by 5 days of age.   Your baby's skin or white part of his or her eyes is more yellow than it was in the hospital.   You feel depressed.  Document Released: 07/26/2005 Document Revised: 01/25/2012 Document Reviewed: 10/24/2011 [employer] Patient Information 2013 [employer].  

## 2012-11-20 ENCOUNTER — Ambulatory Visit (INDEPENDENT_AMBULATORY_CARE_PROVIDER_SITE_OTHER): Payer: Medicaid Other

## 2012-11-20 VITALS — BP 105/71 | HR 92 | Wt 144.8 lb

## 2012-11-20 DIAGNOSIS — O09219 Supervision of pregnancy with history of pre-term labor, unspecified trimester: Secondary | ICD-10-CM

## 2012-11-20 MED ORDER — HYDROXYPROGESTERONE CAPROATE 250 MG/ML IM OIL
250.0000 mg | TOPICAL_OIL | Freq: Once | INTRAMUSCULAR | Status: AC
  Administered 2012-11-20: 250 mg via INTRAMUSCULAR

## 2012-11-27 ENCOUNTER — Ambulatory Visit (INDEPENDENT_AMBULATORY_CARE_PROVIDER_SITE_OTHER): Payer: Medicaid Other | Admitting: Obstetrics and Gynecology

## 2012-11-27 ENCOUNTER — Other Ambulatory Visit: Payer: Self-pay | Admitting: Obstetrics and Gynecology

## 2012-11-27 VITALS — BP 108/73 | Temp 96.6°F | Wt 144.2 lb

## 2012-11-27 DIAGNOSIS — O09219 Supervision of pregnancy with history of pre-term labor, unspecified trimester: Secondary | ICD-10-CM

## 2012-11-27 DIAGNOSIS — Z3483 Encounter for supervision of other normal pregnancy, third trimester: Secondary | ICD-10-CM

## 2012-11-27 DIAGNOSIS — O09213 Supervision of pregnancy with history of pre-term labor, third trimester: Secondary | ICD-10-CM

## 2012-11-27 DIAGNOSIS — Z23 Encounter for immunization: Secondary | ICD-10-CM

## 2012-11-27 LAB — CBC
HCT: 33.8 % — ABNORMAL LOW (ref 36.0–46.0)
Hemoglobin: 11.1 g/dL — ABNORMAL LOW (ref 12.0–15.0)
MCH: 26.4 pg (ref 26.0–34.0)
MCHC: 32.8 g/dL (ref 30.0–36.0)

## 2012-11-27 LAB — POCT URINALYSIS DIP (DEVICE)
Ketones, ur: NEGATIVE mg/dL
Protein, ur: 30 mg/dL — AB
Specific Gravity, Urine: >=1.03 (ref 1.005–1.030)

## 2012-11-27 MED ORDER — TETANUS-DIPHTH-ACELL PERTUSSIS 5-2.5-18.5 LF-MCG/0.5 IM SUSP
0.5000 mL | Freq: Once | INTRAMUSCULAR | Status: AC
  Administered 2012-11-27: 0.5 mL via INTRAMUSCULAR

## 2012-11-27 MED ORDER — HYDROXYPROGESTERONE CAPROATE 250 MG/ML IM OIL
250.0000 mg | TOPICAL_OIL | Freq: Once | INTRAMUSCULAR | Status: AC
  Administered 2012-11-27: 250 mg via INTRAMUSCULAR

## 2012-11-27 NOTE — Progress Notes (Signed)
RLP discussed. Denies UCs today. Has lower abd pains q night. VE done  Has MFM appt 4/24. 17P today. 1 hr glucola and 28 wk labs today.  White d/c and denies itch or irritation.

## 2012-11-27 NOTE — Progress Notes (Signed)
Pulse- 98  Pain-ligament Vaginal discharge- some itchiness Tdap vaccine consented

## 2012-11-27 NOTE — Addendum Note (Signed)
Addended by: Franchot Mimes on: 11/27/2012 11:32 AM   Modules accepted: Orders

## 2012-11-27 NOTE — Addendum Note (Signed)
Addended by: Kathee Delton on: 11/27/2012 11:01 AM   Modules accepted: Orders

## 2012-11-27 NOTE — Patient Instructions (Signed)

## 2012-11-28 LAB — RPR

## 2012-11-29 ENCOUNTER — Encounter: Payer: Self-pay | Admitting: *Deleted

## 2012-11-30 ENCOUNTER — Encounter (HOSPITAL_COMMUNITY): Payer: Self-pay

## 2012-11-30 ENCOUNTER — Ambulatory Visit (HOSPITAL_COMMUNITY)
Admission: RE | Admit: 2012-11-30 | Discharge: 2012-11-30 | Disposition: A | Discharge status: Home / Self Care | Payer: Medicaid Other | Source: Ambulatory Visit | Attending: Obstetrics and Gynecology | Admitting: Obstetrics and Gynecology

## 2012-11-30 VITALS — BP 100/62 | HR 97 | Wt 146.5 lb

## 2012-11-30 DIAGNOSIS — O403XX1 Polyhydramnios, third trimester, fetus 1: Secondary | ICD-10-CM

## 2012-11-30 DIAGNOSIS — O09299 Supervision of pregnancy with other poor reproductive or obstetric history, unspecified trimester: Secondary | ICD-10-CM | POA: Insufficient documentation

## 2012-11-30 DIAGNOSIS — Z8751 Personal history of pre-term labor: Secondary | ICD-10-CM | POA: Insufficient documentation

## 2012-11-30 DIAGNOSIS — Z8759 Personal history of other complications of pregnancy, childbirth and the puerperium: Secondary | ICD-10-CM

## 2012-11-30 NOTE — Progress Notes (Signed)
[name] [name]  was seen today for an ultrasound appointment.  See full report in [facility].  Impression: Single IUP at 29 0/7 weeks History of preterm delivery at 31 weeks, history of IUFD at 24 weeks possibly due to toxo Fetal growth is appropriate (39th %tile) Several prominent loops of bowel noted - but no obvious bowel dilation Mild polyhydramnios noted (AFI 27 cm)- had normal 1-hr OGTT  Recommendations: Recommend follow-up ultrasound examination in 4 weeks If polyhydramnios is persistent - would recommend antepartum fetal testing beginning at 32 weeks  Alpha Gula, MD

## 2012-12-04 ENCOUNTER — Ambulatory Visit (INDEPENDENT_AMBULATORY_CARE_PROVIDER_SITE_OTHER): Payer: Medicaid Other | Admitting: Obstetrics and Gynecology

## 2012-12-04 VITALS — BP 103/71 | Temp 98.2°F | Wt 147.0 lb

## 2012-12-04 DIAGNOSIS — O0990 Supervision of high risk pregnancy, unspecified, unspecified trimester: Secondary | ICD-10-CM

## 2012-12-04 DIAGNOSIS — O403XX0 Polyhydramnios, third trimester, not applicable or unspecified: Secondary | ICD-10-CM | POA: Insufficient documentation

## 2012-12-04 DIAGNOSIS — O09212 Supervision of pregnancy with history of pre-term labor, second trimester: Secondary | ICD-10-CM

## 2012-12-04 DIAGNOSIS — O09219 Supervision of pregnancy with history of pre-term labor, unspecified trimester: Secondary | ICD-10-CM

## 2012-12-04 DIAGNOSIS — O409XX0 Polyhydramnios, unspecified trimester, not applicable or unspecified: Secondary | ICD-10-CM

## 2012-12-04 DIAGNOSIS — O403XX1 Polyhydramnios, third trimester, fetus 1: Secondary | ICD-10-CM

## 2012-12-04 MED ORDER — HYDROXYPROGESTERONE CAPROATE 250 MG/ML IM OIL
250.0000 mg | TOPICAL_OIL | Freq: Once | INTRAMUSCULAR | Status: AC
  Administered 2012-12-04: 250 mg via INTRAMUSCULAR

## 2012-12-04 NOTE — Patient Instructions (Signed)
Preventing Preterm Labor Preterm labor is when a pregnant woman has contractions that cause the cervix to open, shorten, and thin before 37 weeks of pregnancy. You will have regular contractions (tightening) 2 to 3 minutes apart. This usually causes discomfort or pain. HOME CARE  Eat a healthy diet.  Take your vitamins as told by your doctor.  Drink enough fluids to keep your pee (urine) clear or pale yellow every day.  Get rest and sleep.  Do not have sex if you are at high risk for preterm labor.  Follow your doctor's advice about activity, medicines, and tests.  Avoid stress.  Avoid hard labor or exercise that lasts for a long time.  Do not smoke. GET HELP RIGHT AWAY IF:   You are having contractions.  You have belly (abdominal) pain.  You have bleeding from your vagina.  You have pain when you pee (urinate).  You have abnormal discharge from your vagina.  You have a temperature by mouth above 102 F (38.9 C). MAKE SURE YOU:  Understand these instructions.  Will watch your condition.  Will get help if you are not doing well or get worse. Document Released: 10/22/2008 Document Revised: 10/18/2011 Document Reviewed: 10/22/2008 [facility] Patient Information 2013 [place], [place]. Round Ligament Pain The round ligament is made up of muscle and fibrous tissue. It is attached to the uterus near the fallopian tube. The round ligament is located on both sides of the uterus and helps support the position of the uterus. It usually begins in the second trimester of pregnancy when the uterus comes out of the pelvis. The pain can come and go until the baby is delivered. Round ligament pain is not a serious problem and does not cause harm to the baby. CAUSE During pregnancy the uterus grows the most from the second trimester to delivery. As it grows, it stretches and slightly twists the round ligaments. When the uterus leans from one side to the other, the round ligament on the  opposite side pulls and stretches. This can cause pain. SYMPTOMS  Pain can occur on one side or both sides. The pain is usually a short, sharp, and pinching-like. Sometimes it can be a dull, lingering and aching pain. The pain is located in the lower side of the abdomen or in the groin. The pain is internal and usually starts deep in the groin and moves up to the outside of the hip area. Pain can occur with:  Sudden change in position like getting out of bed or a chair.  Rolling over in bed.  Coughing or sneezing.  Walking too much.  Any type of physical activity. DIAGNOSIS  Your caregiver will make sure there are no serious problems causing the pain. When nothing serious is found, the symptoms usually indicate that the pain is from the round ligament. TREATMENT   Sit down and relax when the pain starts.  Flex your knees up to your belly.  Lay on your side with a pillow under your belly (abdomen) and another one between your legs.  Sit in a hot bath for 15 to 20 minutes or until the pain goes away. HOME CARE INSTRUCTIONS   Only take over-the-counter or prescriptions medicines for pain, discomfort or fever as directed by your caregiver.  Sit and stand slowly.  Avoid long walks if it causes pain.  Stop or lessen your physical activities if it causes pain. SEEK MEDICAL CARE IF:   The pain does not go away with any of your  treatment.  You need stronger medication for the pain.  You develop back pain that you did not have before with the side pain. SEEK IMMEDIATE MEDICAL CARE IF:   You develop a temperature of 102 F (38.9 C) or higher.  You develop uterine contractions.  You develop vaginal bleeding.  You develop nausea, vomiting or diarrhea.  You develop chills.  You have pain when you urinate. Document Released: 05/04/2008 Document Revised: 10/18/2011 Document Reviewed: 05/04/2008 [facility] Patient Information 2013 [place], [place].

## 2012-12-04 NOTE — Progress Notes (Signed)
Pulse: 99 

## 2012-12-04 NOTE — Progress Notes (Signed)
[place] 4/24: polyhydramniios (AFI 27) and has F/U US with MFM 12/28/12. 1 hr glucola was 91.  Denies UC's, pelvic pressure. Has bilat groin discomfort with walking. RLP discusssed. 17 P given Refilled PNVs. (ran out)

## 2012-12-11 ENCOUNTER — Ambulatory Visit (INDEPENDENT_AMBULATORY_CARE_PROVIDER_SITE_OTHER): Payer: Medicaid Other | Admitting: Advanced Practice Midwife

## 2012-12-11 ENCOUNTER — Other Ambulatory Visit: Payer: Self-pay | Admitting: Advanced Practice Midwife

## 2012-12-11 VITALS — BP 105/73 | Temp 98.7°F | Wt 149.7 lb

## 2012-12-11 DIAGNOSIS — O09219 Supervision of pregnancy with history of pre-term labor, unspecified trimester: Secondary | ICD-10-CM

## 2012-12-11 DIAGNOSIS — O09212 Supervision of pregnancy with history of pre-term labor, second trimester: Secondary | ICD-10-CM

## 2012-12-11 LAB — POCT URINALYSIS DIP (DEVICE)
Glucose, UA: NEGATIVE mg/dL
Specific Gravity, Urine: 1.025 (ref 1.005–1.030)
Urobilinogen, UA: 0.2 mg/dL (ref 0.0–1.0)

## 2012-12-11 MED ORDER — HYDROXYPROGESTERONE CAPROATE 250 MG/ML IM OIL
250.0000 mg | TOPICAL_OIL | Freq: Once | INTRAMUSCULAR | Status: AC
  Administered 2012-12-11: 250 mg via INTRAMUSCULAR

## 2012-12-11 NOTE — Patient Instructions (Signed)
Preventing Preterm Labor Preterm labor is when a pregnant woman has contractions that cause the cervix to open, shorten, and thin before 37 weeks of pregnancy. You will have regular contractions (tightening) 2 to 3 minutes apart. This usually causes discomfort or pain. HOME CARE  Eat a healthy diet.  Take your vitamins as told by your doctor.  Drink enough fluids to keep your pee (urine) clear or pale yellow every day.  Get rest and sleep.  Do not have sex if you are at high risk for preterm labor.  Follow your doctor's advice about activity, medicines, and tests.  Avoid stress.  Avoid hard labor or exercise that lasts for a long time.  Do not smoke. GET HELP RIGHT AWAY IF:   You are having contractions.  You have belly (abdominal) pain.  You have bleeding from your vagina.  You have pain when you pee (urinate).  You have abnormal discharge from your vagina.  You have a temperature by mouth above 102 F (38.9 C). MAKE SURE YOU:  Understand these instructions.  Will watch your condition.  Will get help if you are not doing well or get worse. Document Released: 10/22/2008 Document Revised: 10/18/2011 Document Reviewed: 10/22/2008 [employer] Patient Information 2013 [employer].  

## 2012-12-11 NOTE — Progress Notes (Signed)
Pulse: 92

## 2012-12-11 NOTE — Progress Notes (Signed)
Feels contractions in the evenings. No bleeding. Last US showed cervical length of 3cm in late April. Discussed with Dr Jolayne Panther. Continue 17P and hydration. PTL precautions.

## 2012-12-18 ENCOUNTER — Ambulatory Visit (INDEPENDENT_AMBULATORY_CARE_PROVIDER_SITE_OTHER): Payer: Medicaid Other | Admitting: Family

## 2012-12-18 ENCOUNTER — Other Ambulatory Visit: Payer: Self-pay | Admitting: Family

## 2012-12-18 VITALS — BP 113/72 | Temp 97.1°F | Wt 149.5 lb

## 2012-12-18 DIAGNOSIS — O23593 Infection of other part of genital tract in pregnancy, third trimester: Secondary | ICD-10-CM

## 2012-12-18 DIAGNOSIS — O403XX1 Polyhydramnios, third trimester, fetus 1: Secondary | ICD-10-CM

## 2012-12-18 DIAGNOSIS — O409XX0 Polyhydramnios, unspecified trimester, not applicable or unspecified: Secondary | ICD-10-CM

## 2012-12-18 DIAGNOSIS — O09213 Supervision of pregnancy with history of pre-term labor, third trimester: Secondary | ICD-10-CM

## 2012-12-18 DIAGNOSIS — N760 Acute vaginitis: Secondary | ICD-10-CM

## 2012-12-18 DIAGNOSIS — O09219 Supervision of pregnancy with history of pre-term labor, unspecified trimester: Secondary | ICD-10-CM

## 2012-12-18 DIAGNOSIS — O2390 Unspecified genitourinary tract infection in pregnancy, unspecified trimester: Secondary | ICD-10-CM

## 2012-12-18 LAB — POCT URINALYSIS DIP (DEVICE)
Bilirubin Urine: NEGATIVE
Glucose, UA: NEGATIVE mg/dL
Ketones, ur: NEGATIVE mg/dL
Protein, ur: NEGATIVE mg/dL
Specific Gravity, Urine: 1.01 (ref 1.005–1.030)

## 2012-12-18 MED ORDER — HYDROXYPROGESTERONE CAPROATE 250 MG/ML IM OIL
250.0000 mg | TOPICAL_OIL | INTRAMUSCULAR | Status: AC
  Administered 2012-12-18 – 2013-01-08 (×3): 250 mg via INTRAMUSCULAR

## 2012-12-18 MED ORDER — FLUCONAZOLE 150 MG PO TABS: 150.0000 mg | ORAL_TABLET | Freq: Once | ORAL | Status: DC

## 2012-12-18 MED ORDER — NITROFURANTOIN MONOHYD MACRO 100 MG PO CAPS: 100.0000 mg | ORAL_CAPSULE | Freq: Two times a day (BID) | ORAL | Status: DC

## 2012-12-18 NOTE — Progress Notes (Signed)
Reports vaginal itching and burning with urination; wet prep collected; RX macrobid and diflucan.  Urine culture sent.  Reports itching at injection site of 17p and pressure with breathing, no wheezing.  Consulted with Dr. Jolayne Panther > continue with 17p, consider benadryl prior to appt.  AFI today by [place] Muta due to history of polyhydramnios > reviewed ultrasound report and Dr. Tracie Harrier plans to follow-up with pt with ultrasound next week and if AFI high begin fetal testing.

## 2012-12-18 NOTE — Progress Notes (Signed)
C/o yellow discharge with itching and burning when urinating.  Reports passing blood last week but is no longer bleeding now.  Baby is moving good.  Pt. States that since 17P injection she has experienced lower abdominal pain and itching at the injection site.

## 2012-12-19 LAB — CULTURE, OB URINE: Organism ID, Bacteria: NO GROWTH

## 2012-12-19 LAB — WET PREP, GENITAL
Clue Cells Wet Prep HPF POC: NONE SEEN
Trich, Wet Prep: NONE SEEN
WBC, Wet Prep HPF POC: NONE SEEN
Yeast Wet Prep HPF POC: NONE SEEN

## 2012-12-25 ENCOUNTER — Ambulatory Visit (INDEPENDENT_AMBULATORY_CARE_PROVIDER_SITE_OTHER): Payer: Medicaid Other | Admitting: Obstetrics & Gynecology

## 2012-12-25 VITALS — BP 107/72 | Temp 97.0°F | Wt 151.0 lb

## 2012-12-25 DIAGNOSIS — O403XX1 Polyhydramnios, third trimester, fetus 1: Secondary | ICD-10-CM

## 2012-12-25 DIAGNOSIS — O09219 Supervision of pregnancy with history of pre-term labor, unspecified trimester: Secondary | ICD-10-CM

## 2012-12-25 DIAGNOSIS — Z8759 Personal history of other complications of pregnancy, childbirth and the puerperium: Secondary | ICD-10-CM

## 2012-12-25 DIAGNOSIS — O09299 Supervision of pregnancy with other poor reproductive or obstetric history, unspecified trimester: Secondary | ICD-10-CM

## 2012-12-25 DIAGNOSIS — O409XX0 Polyhydramnios, unspecified trimester, not applicable or unspecified: Secondary | ICD-10-CM

## 2012-12-25 DIAGNOSIS — O09213 Supervision of pregnancy with history of pre-term labor, third trimester: Secondary | ICD-10-CM

## 2012-12-25 DIAGNOSIS — O0990 Supervision of high risk pregnancy, unspecified, unspecified trimester: Secondary | ICD-10-CM

## 2012-12-25 LAB — POCT URINALYSIS DIP (DEVICE)
Bilirubin Urine: NEGATIVE
Glucose, UA: NEGATIVE mg/dL
Hgb urine dipstick: NEGATIVE
Ketones, ur: NEGATIVE mg/dL
Nitrite: NEGATIVE
Protein, ur: NEGATIVE mg/dL
Specific Gravity, Urine: 1.02 (ref 1.005–1.030)
Urobilinogen, UA: 0.2 mg/dL (ref 0.0–1.0)
pH: 6.5 (ref 5.0–8.0)

## 2012-12-25 NOTE — Progress Notes (Signed)
Pulse 105 C/o constant pelvic pain.

## 2012-12-25 NOTE — Patient Instructions (Signed)
Return to clinic for any obstetric concerns or go to [facility] for evaluation  

## 2012-12-25 NOTE — Progress Notes (Signed)
[place] for growth & NST @ MFM on 5/22

## 2012-12-25 NOTE — Progress Notes (Signed)
NST performed today was reviewed and was found to be reactive.  Continue recommended antenatal testing and prenatal care. Continue weekly 17P.  No other complaints or concerns.  Fetal movement and labor precautions reviewed.

## 2012-12-28 ENCOUNTER — Ambulatory Visit (HOSPITAL_COMMUNITY)
Admission: RE | Admit: 2012-12-28 | Discharge: 2012-12-28 | Disposition: A | Discharge status: Home / Self Care | Payer: Medicaid Other | Source: Ambulatory Visit | Attending: Obstetrics & Gynecology | Admitting: Obstetrics & Gynecology

## 2012-12-28 DIAGNOSIS — Z8751 Personal history of pre-term labor: Secondary | ICD-10-CM | POA: Insufficient documentation

## 2012-12-28 DIAGNOSIS — O09299 Supervision of pregnancy with other poor reproductive or obstetric history, unspecified trimester: Secondary | ICD-10-CM | POA: Insufficient documentation

## 2012-12-28 DIAGNOSIS — Z8759 Personal history of other complications of pregnancy, childbirth and the puerperium: Secondary | ICD-10-CM

## 2012-12-28 DIAGNOSIS — O403XX1 Polyhydramnios, third trimester, fetus 1: Secondary | ICD-10-CM

## 2012-12-28 DIAGNOSIS — O409XX0 Polyhydramnios, unspecified trimester, not applicable or unspecified: Secondary | ICD-10-CM | POA: Insufficient documentation

## 2013-01-02 ENCOUNTER — Ambulatory Visit (INDEPENDENT_AMBULATORY_CARE_PROVIDER_SITE_OTHER): Payer: Medicaid Other | Admitting: *Deleted

## 2013-01-02 VITALS — BP 103/69

## 2013-01-02 DIAGNOSIS — O09299 Supervision of pregnancy with other poor reproductive or obstetric history, unspecified trimester: Secondary | ICD-10-CM

## 2013-01-02 DIAGNOSIS — Z8759 Personal history of other complications of pregnancy, childbirth and the puerperium: Secondary | ICD-10-CM

## 2013-01-02 NOTE — Progress Notes (Signed)
P-85 

## 2013-01-05 ENCOUNTER — Ambulatory Visit (INDEPENDENT_AMBULATORY_CARE_PROVIDER_SITE_OTHER): Payer: Medicaid Other | Admitting: *Deleted

## 2013-01-05 VITALS — BP 92/59

## 2013-01-05 DIAGNOSIS — O09299 Supervision of pregnancy with other poor reproductive or obstetric history, unspecified trimester: Secondary | ICD-10-CM

## 2013-01-05 NOTE — Progress Notes (Signed)
NST reviewed and reactive.  Loletta Harper L. Harraway-Smith, M.D., FACOG    

## 2013-01-05 NOTE — Progress Notes (Signed)
P=93 

## 2013-01-08 ENCOUNTER — Encounter: Payer: Self-pay | Admitting: Obstetrics and Gynecology

## 2013-01-08 ENCOUNTER — Ambulatory Visit (INDEPENDENT_AMBULATORY_CARE_PROVIDER_SITE_OTHER): Payer: Medicaid Other | Admitting: Obstetrics and Gynecology

## 2013-01-08 VITALS — BP 107/69 | Temp 98.1°F | Wt 152.8 lb

## 2013-01-08 DIAGNOSIS — O0990 Supervision of high risk pregnancy, unspecified, unspecified trimester: Secondary | ICD-10-CM

## 2013-01-08 DIAGNOSIS — O09299 Supervision of pregnancy with other poor reproductive or obstetric history, unspecified trimester: Secondary | ICD-10-CM

## 2013-01-08 DIAGNOSIS — Z8759 Personal history of other complications of pregnancy, childbirth and the puerperium: Secondary | ICD-10-CM

## 2013-01-08 DIAGNOSIS — O09219 Supervision of pregnancy with history of pre-term labor, unspecified trimester: Secondary | ICD-10-CM

## 2013-01-08 DIAGNOSIS — O403XX1 Polyhydramnios, third trimester, fetus 1: Secondary | ICD-10-CM

## 2013-01-08 DIAGNOSIS — O409XX0 Polyhydramnios, unspecified trimester, not applicable or unspecified: Secondary | ICD-10-CM

## 2013-01-08 LAB — POCT URINALYSIS DIP (DEVICE)
Bilirubin Urine: NEGATIVE
Glucose, UA: NEGATIVE mg/dL
Nitrite: NEGATIVE
Urobilinogen, UA: 0.2 mg/dL (ref 0.0–1.0)

## 2013-01-08 NOTE — Progress Notes (Signed)
Pulse: 86

## 2013-01-08 NOTE — Progress Notes (Signed)
NST reviewed and reactive. Patient complaining of dysuria- will check urine culture. Continue weekly 17-P. FM and labor precautions reviewed.

## 2013-01-08 NOTE — Progress Notes (Signed)
Pt reports painful urination x2 wks and decreased FM x3 days.  Good FM during NST and pt was aware.

## 2013-01-09 LAB — CULTURE, OB URINE: Organism ID, Bacteria: NO GROWTH

## 2013-01-10 NOTE — Progress Notes (Signed)
NST 01/02/13 reviewed and reactive

## 2013-01-12 ENCOUNTER — Ambulatory Visit (INDEPENDENT_AMBULATORY_CARE_PROVIDER_SITE_OTHER): Payer: Medicaid Other | Admitting: *Deleted

## 2013-01-12 VITALS — BP 99/64

## 2013-01-12 DIAGNOSIS — O403XX1 Polyhydramnios, third trimester, fetus 1: Secondary | ICD-10-CM

## 2013-01-12 DIAGNOSIS — O409XX0 Polyhydramnios, unspecified trimester, not applicable or unspecified: Secondary | ICD-10-CM

## 2013-01-12 DIAGNOSIS — O09299 Supervision of pregnancy with other poor reproductive or obstetric history, unspecified trimester: Secondary | ICD-10-CM

## 2013-01-12 NOTE — Progress Notes (Signed)
P-100 

## 2013-01-12 NOTE — Progress Notes (Signed)
NST reviewed and reactive.  

## 2013-01-15 ENCOUNTER — Ambulatory Visit (INDEPENDENT_AMBULATORY_CARE_PROVIDER_SITE_OTHER): Payer: Medicaid Other | Admitting: Family Medicine

## 2013-01-15 VITALS — BP 108/73 | Temp 97.5°F | Wt 152.4 lb

## 2013-01-15 DIAGNOSIS — Z8759 Personal history of other complications of pregnancy, childbirth and the puerperium: Secondary | ICD-10-CM

## 2013-01-15 DIAGNOSIS — O403XX1 Polyhydramnios, third trimester, fetus 1: Secondary | ICD-10-CM

## 2013-01-15 DIAGNOSIS — O09299 Supervision of pregnancy with other poor reproductive or obstetric history, unspecified trimester: Secondary | ICD-10-CM

## 2013-01-15 DIAGNOSIS — O409XX0 Polyhydramnios, unspecified trimester, not applicable or unspecified: Secondary | ICD-10-CM

## 2013-01-15 LAB — POCT URINALYSIS DIP (DEVICE)
Hgb urine dipstick: NEGATIVE
Nitrite: NEGATIVE
Specific Gravity, Urine: 1.02 (ref 1.005–1.030)
Urobilinogen, UA: 0.2 mg/dL (ref 0.0–1.0)
pH: 7 (ref 5.0–8.0)

## 2013-01-15 LAB — OB RESULTS CONSOLE GBS: GBS: NEGATIVE

## 2013-01-15 LAB — OB RESULTS CONSOLE GC/CHLAMYDIA: Chlamydia: NEGATIVE

## 2013-01-15 NOTE — Progress Notes (Signed)
P=102, c/o trace edema in feet/hands, Used Interpreter, c/o pelvic pressure especially when urinating, and c/o contractions at night,

## 2013-01-15 NOTE — Patient Instructions (Signed)

## 2013-01-15 NOTE — Progress Notes (Signed)
NST reactive today. Ctx 4-5 times an hour. Some strong. No bleeding. White discharge - no itching. Labor precautions reviewed. GBS and GC/Chlamydia done today. Continue NST twice weekly due to hx IUFD.  NST with AFI Friday.

## 2013-01-16 LAB — GC/CHLAMYDIA PROBE AMP: GC Probe RNA: NEGATIVE

## 2013-01-19 ENCOUNTER — Ambulatory Visit (INDEPENDENT_AMBULATORY_CARE_PROVIDER_SITE_OTHER): Payer: Medicaid Other | Admitting: *Deleted

## 2013-01-19 ENCOUNTER — Encounter: Payer: Self-pay | Admitting: Family Medicine

## 2013-01-19 VITALS — BP 99/66

## 2013-01-19 DIAGNOSIS — O09299 Supervision of pregnancy with other poor reproductive or obstetric history, unspecified trimester: Secondary | ICD-10-CM

## 2013-01-19 DIAGNOSIS — O403XX1 Polyhydramnios, third trimester, fetus 1: Secondary | ICD-10-CM

## 2013-01-19 DIAGNOSIS — O409XX0 Polyhydramnios, unspecified trimester, not applicable or unspecified: Secondary | ICD-10-CM

## 2013-01-19 NOTE — Progress Notes (Signed)
P = 96   [place] for growth on 6/20 @ 0930

## 2013-01-21 NOTE — Progress Notes (Signed)
NST performed today was reviewed and was found to be reactive.  AFI 15.8 cm. Continue recommended antenatal testing and prenatal care.

## 2013-01-22 ENCOUNTER — Encounter: Payer: Self-pay | Admitting: Family Medicine

## 2013-01-22 ENCOUNTER — Ambulatory Visit (INDEPENDENT_AMBULATORY_CARE_PROVIDER_SITE_OTHER): Payer: Medicaid Other | Admitting: Family Medicine

## 2013-01-22 VITALS — BP 105/72 | Temp 98.0°F | Wt 154.1 lb

## 2013-01-22 DIAGNOSIS — O09299 Supervision of pregnancy with other poor reproductive or obstetric history, unspecified trimester: Secondary | ICD-10-CM

## 2013-01-22 DIAGNOSIS — Z8759 Personal history of other complications of pregnancy, childbirth and the puerperium: Secondary | ICD-10-CM

## 2013-01-22 DIAGNOSIS — O09219 Supervision of pregnancy with history of pre-term labor, unspecified trimester: Secondary | ICD-10-CM

## 2013-01-22 DIAGNOSIS — O09213 Supervision of pregnancy with history of pre-term labor, third trimester: Secondary | ICD-10-CM

## 2013-01-22 LAB — POCT URINALYSIS DIP (DEVICE)
Bilirubin Urine: NEGATIVE
Ketones, ur: NEGATIVE mg/dL
Protein, ur: NEGATIVE mg/dL
Specific Gravity, Urine: >=1.03 (ref 1.005–1.030)
pH: 6.5 (ref 5.0–8.0)

## 2013-01-22 MED ORDER — HYDROXYPROGESTERONE CAPROATE 250 MG/ML IM OIL
250.0000 mg | TOPICAL_OIL | Freq: Once | INTRAMUSCULAR | Status: AC
  Administered 2013-01-22: 250 mg via INTRAMUSCULAR

## 2013-01-22 NOTE — Progress Notes (Signed)
NST reviewed and reactive. Has U/s for growth this wk. Poly resolved. Cultures are negative. Last 17 P today

## 2013-01-22 NOTE — Progress Notes (Signed)
Pulse: 92

## 2013-01-22 NOTE — Patient Instructions (Signed)
Breastfeeding A change in hormones during your pregnancy causes growth of your breast tissue and an increase in number and size of milk ducts. The hormone prolactin allows proteins, sugars, and fats from your blood supply to make breast milk in your milk-producing glands. The hormone progesterone prevents breast milk from being released before the birth of your baby. After the birth of your baby, your progesterone level decreases allowing breast milk to be released. Thoughts of your baby, as well as his or her sucking or crying, can stimulate the release of milk from the milk-producing glands. Deciding to breastfeed (nurse) is one of the best choices you can make for you and your baby. The information that follows gives a brief review of the benefits, as well as other important skills to know about breastfeeding. BENEFITS OF BREASTFEEDING For your baby  The first milk (colostrum) helps your baby's digestive system function better.   There are antibodies in your milk that help your baby fight off infections.   Your baby has a lower incidence of asthma, allergies, and sudden infant death syndrome (SIDS).   The nutrients in breast milk are better for your baby than infant formulas.  Breast milk improves your baby's brain development.   Your baby will have less gas, colic, and constipation.  Your baby is less likely to develop other conditions, such as childhood obesity, asthma, or diabetes mellitus. For you  Breastfeeding helps develop a very special bond between you and your baby.   Breastfeeding is convenient, always available at the correct temperature, and costs nothing.   Breastfeeding helps to burn calories and helps you lose the weight gained during pregnancy.   Breastfeeding makes your uterus contract back down to normal size faster and slows bleeding following delivery.   Breastfeeding mothers have a lower risk of developing osteoporosis or breast or ovarian cancer later  in life.  BREASTFEEDING FREQUENCY  A healthy, full-term baby may breastfeed as often as every hour or space his or her feedings to every 3 hours. Breastfeeding frequency will vary from baby to baby.   Newborns should be fed no less than every 2 3 hours during the day and every 4 5 hours during the night. You should breastfeed a minimum of 8 feedings in a 24 hour period.  Awaken your baby to breastfeed if it has been 3 4 hours since the last feeding.  Breastfeed when you feel the need to reduce the fullness of your breasts or when your newborn shows signs of hunger. Signs that your baby may be hungry include:  Increased alertness or activity.  Stretching.  Movement of the head from side to side.  Movement of the head and opening of the mouth when the corner of the mouth or cheek is stroked (rooting).  Increased sucking sounds, smacking lips, cooing, sighing, or squeaking.  Hand-to-mouth movements.  Increased sucking of fingers or hands.  Fussing.  Intermittent crying.  Signs of extreme hunger will require calming and consoling before you try to feed your baby. Signs of extreme hunger may include:  Restlessness.  A loud, strong cry.  Screaming.  Frequent feeding will help you make more milk and will help prevent problems, such as sore nipples and engorgement of the breasts.  BREASTFEEDING   Whether lying down or sitting, be sure that the baby's abdomen is facing your abdomen.   Support your breast with 4 fingers under your breast and your thumb above your nipple. Make sure your fingers are well away from   your nipple and your baby's mouth.   Stroke your baby's lips gently with your finger or nipple.   When your baby's mouth is open wide enough, place all of your nipple and as much of the colored area around your nipple (areola) as possible into your baby's mouth.  More areola should be visible above his or her upper lip than below his or her lower lip.  Your  baby's tongue should be between his or her lower gum and your breast.  Ensure that your baby's mouth is correctly positioned around the nipple (latched). Your baby's lips should create a seal on your breast.  Signs that your baby has effectively latched onto your nipple include:  Tugging or sucking without pain.  Swallowing heard between sucks.  Absent click or smacking sound.  Muscle movement above and in front of his or her ears with sucking.  Your baby must suck about 2 3 minutes in order to get your milk. Allow your baby to feed on each breast as long as he or she wants. Nurse your baby until he or she unlatches or falls asleep at the first breast, then offer the second breast.  Signs that your baby is full and satisfied include:  A gradual decrease in the number of sucks or complete cessation of sucking.  Falling asleep.  Extension or relaxation of his or her body.  Retention of a small amount of milk in his or her mouth.  Letting go of your breast by himself or herself.  Signs of effective breastfeeding in you include:  Breasts that have increased firmness, weight, and size prior to feeding.  Breasts that are softer after nursing.  Increased milk volume, as well as a change in milk consistency and color by the 5th day of breastfeeding.  Breast fullness relieved by breastfeeding.  Nipples are not sore, cracked, or bleeding.  If needed, break the suction by putting your finger into the corner of your baby's mouth and sliding your finger between his or her gums. Then, remove your breast from his or her mouth.  It is common for babies to spit up a small amount after a feeding.  Babies often swallow air during feeding. This can make babies fussy. Burping your baby between breasts can help with this.  Vitamin D supplements are recommended for babies who get only breast milk.  Avoid using a pacifier during your baby's first 4 6 weeks.  Avoid supplemental feedings of  water, formula, or juice in place of breastfeeding. Breast milk is all the food your baby needs. It is not necessary for your baby to have water or formula. Your breasts will make more milk if supplemental feedings are avoided during the early weeks. HOW TO TELL WHETHER YOUR BABY IS GETTING ENOUGH BREAST MILK Wondering whether or not your baby is getting enough milk is a common concern among mothers. You can be assured that your baby is getting enough milk if:   Your baby is actively sucking and you hear swallowing.   Your baby seems relaxed and satisfied after a feeding.   Your baby nurses at least 8 12 times in a 24 hour time period.  During the first 3 5 days of age:  Your baby is wetting at least 3 5 diapers in a 24 hour period. The urine should be clear and pale yellow.  Your baby is having at least 3 4 stools in a 24 hour period. The stool should be soft and yellow.  At   5 7 days of age, your baby is having at least 3 6 stools in a 24 hour period. The stool should be seedy and yellow by 5 days of age.  Your baby has a weight loss less than 7 10% during the first 3 days of age.  Your baby does not lose weight after 3 7 days of age.  Your baby gains 4 7 ounces each week after he or she is 4 days of age.  Your baby gains weight by 5 days of age and is back to birth weight within 2 weeks. ENGORGEMENT In the first week after your baby is born, you may experience extremely full breasts (engorgement). When engorged, your breasts may feel heavy, warm, or tender to the touch. Engorgement peaks within 24 48 hours after delivery of your baby.  Engorgement may be reduced by:  Continuing to breastfeed.  Increasing the frequency of breastfeeding.  Taking warm showers or applying warm, moist heat to your breasts just before each feeding. This increases circulation and helps the milk flow.   Gently massaging your breast before and during the feedings. With your fingertips, massage from  your chest wall towards your nipple in a circular motion.   Ensuring that your baby empties at least one breast at every feeding. It also helps to start the next feeding on the opposite breast.   Expressing breast milk by hand or by using a breast pump to empty the breasts if your baby is sleepy, or not nursing well. You may also want to express milk if you are returning to work oryou feel you are getting engorged.  Ensuring your baby is latched on and positioned properly while breastfeeding. If you follow these suggestions, your engorgement should improve in 24 48 hours. If you are still experiencing difficulty, call your lactation consultant or caregiver.  CARING FOR YOURSELF Take care of your breasts.  Bathe or shower daily.   Avoid using soap on your nipples.   Wear a supportive bra. Avoid wearing underwire style bras.  Air dry your nipples for a 3 4minutes after each feeding.   Use only cotton bra pads to absorb breast milk leakage. Leaking of breast milk between feedings is normal.   Use only pure lanolin on your nipples after nursing. You do not need to wash it off before feeding your baby again. Another option is to express a few drops of breast milk and gently massage that milk into your nipples.  Continue breast self-awareness checks. Take care of yourself.  Eat healthy foods. Alternate 3 meals with 3 snacks.  Avoid foods that you notice affect your baby in a bad way.  Drink milk, fruit juice, and water to satisfy your thirst (about 8 glasses a day).   Rest often, relax, and take your prenatal vitamins to prevent fatigue, stress, and anemia.  Avoid chewing and smoking tobacco.  Avoid alcohol and drug use.  Take over-the-counter and prescribed medicine only as directed by your caregiver or pharmacist. You should always check with your caregiver or pharmacist before taking any new medicine, vitamin, or herbal supplement.  Know that pregnancy is possible while  breastfeeding. If desired, talk to your caregiver about family planning and safe birth control methods that may be used while breastfeeding. SEEK MEDICAL CARE IF:   You feel like you want to stop breastfeeding or have become frustrated with breastfeeding.  You have painful breasts or nipples.  Your nipples are cracked or bleeding.  Your breasts are red, tender,   or warm.  You have a swollen area on either breast.  You have a fever or chills.  You have nausea or vomiting.  You have drainage from your nipples.  Your breasts do not become full before feedings by the 5th day after delivery.  You feel sad and depressed.  Your baby is too sleepy to eat well.  Your baby is having trouble sleeping.   Your baby is wetting less than 3 diapers in a 24 hour period.  Your baby has less than 3 stools in a 24 hour period.  Your baby's skin or the white part of his or her eyes becomes more yellow.   Your baby is not gaining weight by 5 days of age. MAKE SURE YOU:   Understand these instructions.  Will watch your condition.  Will get help right away if you are not doing well or get worse. Document Released: 07/26/2005 Document Revised: 04/19/2012 Document Reviewed: 03/01/2012 [employer] Patient Information 2014 [employer].  

## 2013-01-24 ENCOUNTER — Encounter: Payer: Self-pay | Admitting: *Deleted

## 2013-01-26 ENCOUNTER — Ambulatory Visit (HOSPITAL_COMMUNITY)
Admission: RE | Admit: 2013-01-26 | Discharge: 2013-01-26 | Disposition: A | Discharge status: Home / Self Care | Payer: Medicaid Other | Source: Ambulatory Visit | Attending: Obstetrics & Gynecology | Admitting: Obstetrics & Gynecology

## 2013-01-26 ENCOUNTER — Ambulatory Visit (INDEPENDENT_AMBULATORY_CARE_PROVIDER_SITE_OTHER): Payer: Medicaid Other | Admitting: *Deleted

## 2013-01-26 VITALS — BP 103/67 | Temp 97.3°F | Wt 155.2 lb

## 2013-01-26 DIAGNOSIS — Z8751 Personal history of pre-term labor: Secondary | ICD-10-CM | POA: Insufficient documentation

## 2013-01-26 DIAGNOSIS — O409XX0 Polyhydramnios, unspecified trimester, not applicable or unspecified: Secondary | ICD-10-CM

## 2013-01-26 DIAGNOSIS — O403XX1 Polyhydramnios, third trimester, fetus 1: Secondary | ICD-10-CM

## 2013-01-26 DIAGNOSIS — O403XX0 Polyhydramnios, third trimester, not applicable or unspecified: Secondary | ICD-10-CM

## 2013-01-26 DIAGNOSIS — O09299 Supervision of pregnancy with other poor reproductive or obstetric history, unspecified trimester: Secondary | ICD-10-CM | POA: Insufficient documentation

## 2013-01-26 NOTE — Progress Notes (Signed)
P= 75, here for nst with Interpreter at bedside, has [place] before nst

## 2013-01-29 ENCOUNTER — Encounter: Payer: Self-pay | Admitting: Obstetrics and Gynecology

## 2013-01-29 ENCOUNTER — Ambulatory Visit (INDEPENDENT_AMBULATORY_CARE_PROVIDER_SITE_OTHER): Payer: Medicaid Other | Admitting: Obstetrics and Gynecology

## 2013-01-29 VITALS — BP 102/75 | Temp 97.7°F | Wt 156.5 lb

## 2013-01-29 DIAGNOSIS — O409XX0 Polyhydramnios, unspecified trimester, not applicable or unspecified: Secondary | ICD-10-CM

## 2013-01-29 DIAGNOSIS — O09219 Supervision of pregnancy with history of pre-term labor, unspecified trimester: Secondary | ICD-10-CM

## 2013-01-29 DIAGNOSIS — O403XX1 Polyhydramnios, third trimester, fetus 1: Secondary | ICD-10-CM

## 2013-01-29 DIAGNOSIS — Z8759 Personal history of other complications of pregnancy, childbirth and the puerperium: Secondary | ICD-10-CM

## 2013-01-29 DIAGNOSIS — O09213 Supervision of pregnancy with history of pre-term labor, third trimester: Secondary | ICD-10-CM

## 2013-01-29 DIAGNOSIS — O09299 Supervision of pregnancy with other poor reproductive or obstetric history, unspecified trimester: Secondary | ICD-10-CM

## 2013-01-29 DIAGNOSIS — O0990 Supervision of high risk pregnancy, unspecified, unspecified trimester: Secondary | ICD-10-CM

## 2013-01-29 LAB — POCT URINALYSIS DIP (DEVICE)
Leukocytes, UA: NEGATIVE
Nitrite: NEGATIVE
Protein, ur: NEGATIVE mg/dL
Urobilinogen, UA: 0.2 mg/dL (ref 0.0–1.0)
pH: 6.5 (ref 5.0–8.0)

## 2013-01-29 NOTE — Progress Notes (Signed)
6/20 EFW- 2830gm (45%tile). Patient doing well without complaints. FM/labor precautions reviewed. Will schedule induction of labor on 7/3 at 39 weeks. NST reviewed and reactive

## 2013-01-29 NOTE — Progress Notes (Signed)
Pulse: 100

## 2013-01-30 ENCOUNTER — Encounter (HOSPITAL_COMMUNITY): Payer: Self-pay

## 2013-01-30 ENCOUNTER — Inpatient Hospital Stay (HOSPITAL_COMMUNITY)
Admission: AD | Admit: 2013-01-30 | Discharge: 2013-01-30 | Disposition: A | Discharge status: Home / Self Care | Payer: Medicaid Other | Source: Ambulatory Visit | Attending: Family Medicine | Admitting: Family Medicine

## 2013-01-30 DIAGNOSIS — O0990 Supervision of high risk pregnancy, unspecified, unspecified trimester: Secondary | ICD-10-CM

## 2013-01-30 DIAGNOSIS — O368190 Decreased fetal movements, unspecified trimester, not applicable or unspecified: Secondary | ICD-10-CM | POA: Insufficient documentation

## 2013-01-30 DIAGNOSIS — O479 False labor, unspecified: Secondary | ICD-10-CM | POA: Insufficient documentation

## 2013-01-30 DIAGNOSIS — Z8759 Personal history of other complications of pregnancy, childbirth and the puerperium: Secondary | ICD-10-CM

## 2013-01-30 DIAGNOSIS — O09219 Supervision of pregnancy with history of pre-term labor, unspecified trimester: Secondary | ICD-10-CM

## 2013-01-30 NOTE — MAU Note (Signed)
Pt G3 P1 at 37.5wks having contractions every 10-47min.

## 2013-01-30 NOTE — MAU Note (Signed)
Contractions every 15 minutes tonight. Some bloody show, denies leaking of fluid. Decreased fetal movement since 6pm yesterday.

## 2013-02-01 ENCOUNTER — Other Ambulatory Visit: Payer: Medicaid Other

## 2013-02-01 ENCOUNTER — Encounter (HOSPITAL_COMMUNITY): Payer: Self-pay | Admitting: *Deleted

## 2013-02-01 ENCOUNTER — Inpatient Hospital Stay (HOSPITAL_COMMUNITY)
Admission: AD | Admit: 2013-02-01 | Discharge: 2013-02-03 | DRG: 774 | Disposition: A | Discharge status: Home / Self Care | Payer: Medicaid Other | Source: Ambulatory Visit | Attending: Obstetrics and Gynecology | Admitting: Obstetrics and Gynecology

## 2013-02-01 ENCOUNTER — Ambulatory Visit (HOSPITAL_COMMUNITY): Admission: RE | Admit: 2013-02-01 | Payer: Medicaid Other | Source: Ambulatory Visit

## 2013-02-01 DIAGNOSIS — O409XX0 Polyhydramnios, unspecified trimester, not applicable or unspecified: Principal | ICD-10-CM | POA: Diagnosis present

## 2013-02-01 DIAGNOSIS — O09212 Supervision of pregnancy with history of pre-term labor, second trimester: Secondary | ICD-10-CM

## 2013-02-01 DIAGNOSIS — IMO0001 Reserved for inherently not codable concepts without codable children: Secondary | ICD-10-CM

## 2013-02-01 DIAGNOSIS — Z8759 Personal history of other complications of pregnancy, childbirth and the puerperium: Secondary | ICD-10-CM

## 2013-02-01 DIAGNOSIS — O0990 Supervision of high risk pregnancy, unspecified, unspecified trimester: Secondary | ICD-10-CM

## 2013-02-01 LAB — ABO/RH: ABO/RH(D): O POS

## 2013-02-01 LAB — CBC
Hemoglobin: 11.6 g/dL — ABNORMAL LOW (ref 12.0–15.0)
MCH: 25.5 pg — ABNORMAL LOW (ref 26.0–34.0)
MCV: 78.5 fL (ref 78.0–100.0)
Platelets: 202 10*3/uL (ref 150–400)
RBC: 4.55 MIL/uL (ref 3.87–5.11)
WBC: 7.5 10*3/uL (ref 4.0–10.5)

## 2013-02-01 LAB — RPR: RPR Ser Ql: NONREACTIVE

## 2013-02-01 LAB — TYPE AND SCREEN

## 2013-02-01 MED ORDER — ACETAMINOPHEN 325 MG PO TABS: 650.0000 mg | ORAL_TABLET | ORAL | Status: DC | PRN

## 2013-02-01 MED ORDER — LACTATED RINGERS IV SOLN: 500.0000 mL | INTRAVENOUS | Status: DC | PRN

## 2013-02-01 MED ORDER — DIPHENHYDRAMINE HCL 25 MG PO CAPS: 25.0000 mg | ORAL_CAPSULE | Freq: Four times a day (QID) | ORAL | Status: DC | PRN

## 2013-02-01 MED ORDER — OXYCODONE-ACETAMINOPHEN 5-325 MG PO TABS: 1.0000 | ORAL_TABLET | ORAL | Status: DC | PRN

## 2013-02-01 MED ORDER — NALBUPHINE SYRINGE 5 MG/0.5 ML
10.0000 mg | INJECTION | INTRAMUSCULAR | Status: DC | PRN
  Administered 2013-02-01: 10 mg via INTRAVENOUS
  Filled 2013-02-01 (×2): qty 1

## 2013-02-01 MED ORDER — OXYTOCIN BOLUS FROM INFUSION
500.0000 mL | INTRAVENOUS | Status: DC
  Administered 2013-02-01: 500 mL via INTRAVENOUS

## 2013-02-01 MED ORDER — PRENATAL MULTIVITAMIN CH
1.0000 | ORAL_TABLET | Freq: Every day | ORAL | Status: DC
  Administered 2013-02-01 – 2013-02-03 (×3): 1 via ORAL
  Filled 2013-02-01 (×2): qty 1

## 2013-02-01 MED ORDER — MISOPROSTOL 200 MCG PO TABS
ORAL_TABLET | ORAL | Status: AC
  Filled 2013-02-01: qty 5

## 2013-02-01 MED ORDER — WITCH HAZEL-GLYCERIN EX PADS: 1.0000 "application " | MEDICATED_PAD | CUTANEOUS | Status: DC | PRN

## 2013-02-01 MED ORDER — PHENYLEPHRINE 40 MCG/ML (10ML) SYRINGE FOR IV PUSH (FOR BLOOD PRESSURE SUPPORT)
80.0000 ug | PREFILLED_SYRINGE | INTRAVENOUS | Status: DC | PRN
  Filled 2013-02-01: qty 2

## 2013-02-01 MED ORDER — EPHEDRINE 5 MG/ML INJ
10.0000 mg | INTRAVENOUS | Status: DC | PRN
  Filled 2013-02-01: qty 2

## 2013-02-01 MED ORDER — BENZOCAINE-MENTHOL 20-0.5 % EX AERO
1.0000 "application " | INHALATION_SPRAY | CUTANEOUS | Status: DC | PRN
  Administered 2013-02-01: 1 via TOPICAL

## 2013-02-01 MED ORDER — SENNOSIDES-DOCUSATE SODIUM 8.6-50 MG PO TABS
2.0000 | ORAL_TABLET | Freq: Every day | ORAL | Status: DC
  Administered 2013-02-01 – 2013-02-02 (×2): 2 via ORAL

## 2013-02-01 MED ORDER — TERBUTALINE SULFATE 1 MG/ML IJ SOLN: 0.2500 mg | Freq: Once | INTRAMUSCULAR | Status: DC | PRN

## 2013-02-01 MED ORDER — OXYTOCIN 40 UNITS IN LACTATED RINGERS INFUSION - SIMPLE MED
62.5000 mL/h | INTRAVENOUS | Status: DC
  Administered 2013-02-01: 62.5 mL/h via INTRAVENOUS
  Filled 2013-02-01: qty 1000

## 2013-02-01 MED ORDER — ZOLPIDEM TARTRATE 5 MG PO TABS: 5.0000 mg | ORAL_TABLET | Freq: Every evening | ORAL | Status: DC | PRN

## 2013-02-01 MED ORDER — LIDOCAINE HCL (PF) 1 % IJ SOLN
30.0000 mL | INTRAMUSCULAR | Status: DC | PRN
  Filled 2013-02-01 (×2): qty 30

## 2013-02-01 MED ORDER — IBUPROFEN 600 MG PO TABS
600.0000 mg | ORAL_TABLET | Freq: Four times a day (QID) | ORAL | Status: DC
  Administered 2013-02-01 – 2013-02-03 (×9): 600 mg via ORAL
  Filled 2013-02-01 (×9): qty 1

## 2013-02-01 MED ORDER — LANOLIN HYDROUS EX OINT: TOPICAL_OINTMENT | CUTANEOUS | Status: DC | PRN

## 2013-02-01 MED ORDER — ONDANSETRON HCL 4 MG/2ML IJ SOLN: 4.0000 mg | Freq: Four times a day (QID) | INTRAMUSCULAR | Status: DC | PRN

## 2013-02-01 MED ORDER — FENTANYL 2.5 MCG/ML BUPIVACAINE 1/10 % EPIDURAL INFUSION (WH - ANES): 14.0000 mL/h | INTRAMUSCULAR | Status: DC | PRN

## 2013-02-01 MED ORDER — LACTATED RINGERS IV SOLN: 500.0000 mL | Freq: Once | INTRAVENOUS | Status: DC

## 2013-02-01 MED ORDER — MISOPROSTOL 200 MCG PO TABS
ORAL_TABLET | ORAL | Status: AC
  Administered 2013-02-01: 600 ug via ORAL
  Filled 2013-02-01: qty 3

## 2013-02-01 MED ORDER — SIMETHICONE 80 MG PO CHEW: 80.0000 mg | CHEWABLE_TABLET | ORAL | Status: DC | PRN

## 2013-02-01 MED ORDER — DIPHENHYDRAMINE HCL 50 MG/ML IJ SOLN: 12.5000 mg | INTRAMUSCULAR | Status: DC | PRN

## 2013-02-01 MED ORDER — DIBUCAINE 1 % RE OINT: 1.0000 "application " | TOPICAL_OINTMENT | RECTAL | Status: DC | PRN

## 2013-02-01 MED ORDER — CITRIC ACID-SODIUM CITRATE 334-500 MG/5ML PO SOLN: 30.0000 mL | ORAL | Status: DC | PRN

## 2013-02-01 MED ORDER — TETANUS-DIPHTH-ACELL PERTUSSIS 5-2.5-18.5 LF-MCG/0.5 IM SUSP: 0.5000 mL | Freq: Once | INTRAMUSCULAR | Status: DC

## 2013-02-01 MED ORDER — ONDANSETRON HCL 4 MG/2ML IJ SOLN: 4.0000 mg | INTRAMUSCULAR | Status: DC | PRN

## 2013-02-01 MED ORDER — OXYTOCIN 40 UNITS IN LACTATED RINGERS INFUSION - SIMPLE MED: 1.0000 m[IU]/min | INTRAVENOUS | Status: DC

## 2013-02-01 MED ORDER — IBUPROFEN 600 MG PO TABS: 600.0000 mg | ORAL_TABLET | Freq: Four times a day (QID) | ORAL | Status: DC | PRN

## 2013-02-01 MED ORDER — LACTATED RINGERS IV SOLN
INTRAVENOUS | Status: DC
  Administered 2013-02-01: 05:00:00 via INTRAVENOUS

## 2013-02-01 MED ORDER — ONDANSETRON HCL 4 MG PO TABS: 4.0000 mg | ORAL_TABLET | ORAL | Status: DC | PRN

## 2013-02-01 NOTE — H&P (Signed)
[name] [name] is a 27 y.o. female presenting for active labor. Interview conducted via telephone Pharmacist, community. Ctx began 3 days ago, have become close together and stronger last night. No loss of fluid or bleeding. Baby moving well.  [facility] for history of 24-week IUFD and 31 week preterm delivery. Has been on 17-P. Polyhydramnios with this pregnancy, but otherwise anatomy normal and normal AFP (rest of quad screen not done).   Maternal Medical History:  Reason for admission: Contractions.   Contractions: Onset was more than 2 days ago.   Frequency: regular.   Perceived severity is strong.    Fetal activity: Perceived fetal activity is normal.   Last perceived fetal movement was within the past hour.    Prenatal complications: Polyhydramnios.   Prenatal Complications - Diabetes: none.    OB History   Grav Para Term Preterm Abortions TAB SAB Ect Mult Living   3 2 0 2 0 0 0 0 0 1      Past Medical History  Diagnosis Date  . No pertinent past medical history   . Preterm labor    Past Surgical History  Procedure Laterality Date  . No past surgeries     Family History: family history includes Kidney disease in her father. Social History:  reports that she has never smoked. She has never used smokeless tobacco. She reports that she does not drink alcohol or use illicit drugs.   Prenatal Transfer Tool  Maternal Diabetes: No Genetic Screening: Normal - AFP only, no other genetic results available Maternal Ultrasounds/Referrals: Abnormal:  Findings:   Other: polyhydramnios Fetal Ultrasounds or other Referrals:  None Maternal Substance Abuse:  No Significant Maternal Medications:  Meds include: Progesterone Significant Maternal Lab Results:  Lab values include: Group B Strep negative Other Comments:  polyhydramnios, history 24-wk IUFD  Review of Systems  Constitutional: Negative for fever.  Eyes: Negative for blurred vision.  Respiratory: Negative  for cough.   Cardiovascular: Negative for chest pain.  Gastrointestinal: Positive for abdominal pain.  Neurological: Positive for dizziness. Negative for headaches.    Dilation: 5 Effacement (%): 90 Station: -3 Exam by:: Rudi Coco RN Blood pressure 110/75, pulse 88, temperature 98 F (36.7 C), temperature source Oral, resp. rate 18, height 5\' 2"  (1.575 m), weight 70.761 kg (156 lb), last menstrual period 05/11/2012.  Maternal Exam:  Uterine Assessment: Contraction strength is firm.  Contraction frequency is regular.   Abdomen: Fetal presentation: vertex  Introitus: Normal vulva. Normal vagina.  Ferning test: not done.   Pelvis: adequate for delivery.   Cervix: Cervix evaluated by digital exam.     Physical Exam  Constitutional: She is oriented to person, place, and time. She appears well-developed and well-nourished. No distress.  HENT:  Head: Normocephalic and atraumatic.  Eyes: Conjunctivae and EOM are normal.  Neck: Normal range of motion. Neck supple.  Cardiovascular: Normal rate.   Respiratory: Effort normal. No respiratory distress.  GI: Soft. There is no rebound and no guarding.  Musculoskeletal: Normal range of motion. She exhibits no edema and no tenderness.  Neurological: She is alert and oriented to person, place, and time.  Skin: Skin is warm and dry.  Psychiatric: She has a normal mood and affect.    GEN:  WNWD, no distress HEENT:  NCAT, EOMI, conjunctiva clear NECK:  Supple, non-tender, no thyromegaly, trachea midline CV: RRR, no murmur RESP:  CTAB ABD:  Soft, non-tender, no guarding or rebound, normal bowel sounds EXTREM:  Warm, well  perfused, no edema or tenderness NEURO:  Alert, oriented, no focal deficits  Dilation: 5 Effacement (%): 90 Station: -3 Presentation: Vertex Exam by:: Rudi Coco RN  FHTs:  135, mod var, accels present, no decels TOCO:  q 3-4 min  Prenatal labs: ABO, Rh: O/POS/-- (02/06 1017) Antibody: NEG (02/06 1017) Rubella: 1.00  (02/06 1017) RPR: NON REAC (04/21 1132)  HBsAg: NEGATIVE (02/06 1017)  HIV: NON REACTIVE (02/06 1017)  GBS: Negative (06/09 0000)   Assessment/Plan: 27 y.o. G3P0201 at [redacted]w[redacted]d here with spontaneous onset of labor - FHTs cat I - GBS negative - Admit to L&D - Does not want epidural - Anticipate SVD   Napoleon Form 02/01/2013, 4:50 AM

## 2013-02-01 NOTE — Progress Notes (Signed)
UR completed 

## 2013-02-01 NOTE — Progress Notes (Signed)
Attempted several times to reposition pt, explaining why reposition is important (husband interpreting).  Pt does not stay in repositioned position but will turn back in supine psoition

## 2013-02-01 NOTE — H&P (Signed)
Attestation of Attending Supervision of Advanced Practitioner: Evaluation and management procedures were performed by the PA/NP/CNM/OB Fellow under my supervision/collaboration. Chart reviewed and agree with management and plan.  Douglas Rooks V 02/01/2013 7:50 AM

## 2013-02-01 NOTE — MAU Note (Signed)
Dr. Thad Ranger notified of pt.

## 2013-02-01 NOTE — MAU Note (Signed)
Pt having contractions today.

## 2013-02-02 DIAGNOSIS — O409XX0 Polyhydramnios, unspecified trimester, not applicable or unspecified: Secondary | ICD-10-CM

## 2013-02-02 MED ORDER — IBUPROFEN 600 MG PO TABS: 600.0000 mg | ORAL_TABLET | Freq: Four times a day (QID) | ORAL | Status: DC | PRN

## 2013-02-02 NOTE — Progress Notes (Addendum)
Post Partum Day 1 Subjective: no complaints, up ad lib, voiding, tolerating PO and + flatus  Objective: Blood pressure 107/74, pulse 83, temperature 98.6 F (37 C), temperature source Oral, resp. rate 18, height 5\' 2"  (1.575 m), weight 70.761 kg (156 lb), last menstrual period 05/11/2012, SpO2 96.00%, unknown if currently breastfeeding.  Physical Exam:  General: alert, cooperative and no distress Lochia: appropriate Uterine Fundus: firm DVT Evaluation: No evidence of DVT seen on physical exam. No cords or calf tenderness. No significant calf/ankle edema.   Recent Labs  02/01/13 0510  HGB 11.6*  HCT 35.7*    Assessment/Plan: Baby to stay due to intermediate Bili. Will plan for discharge tomorrow.   LOS: 1 day   Everlene Other 02/02/2013, 5:25 PM   Seen also by me Agree with note Wynelle Bourgeois CNM

## 2013-02-02 NOTE — Discharge Summary (Signed)
Obstetric Discharge Summary Reason for Admission: onset of labor Prenatal Procedures: ultrasound Intrapartum Procedures: spontaneous vaginal delivery Postpartum Procedures: none Complications-Operative and Postpartum: none Hemoglobin  Date Value Range Status  02/01/2013 11.6* 12.0 - 15.0 g/dL Final     HCT  Date Value Range Status  02/01/2013 35.7* 36.0 - 46.0 % Final    Physical Exam:  General: alert, cooperative and no distress Lochia: appropriate Uterine Fundus: firm DVT Evaluation: No evidence of DVT seen on physical exam. No cords or calf tenderness. No significant calf/ankle edema.  Discharge Diagnoses: Term Pregnancy-delivered  Hospital Course: [name] [name] is a 27 y.o. [id number] at [redacted]w[redacted]d who was admitted to the hospital after presenting for active labor.  Patient had an uncomplicated SVD.  Pt is breast feeding and plans to use Depo for contraception. [facility] in 6 weeks.   Discharge Information: Date: 02/02/2013 Activity: pelvic rest Diet: routine Medications: PNV and Ibuprofen Condition: stable Instructions: refer to practice specific booklet Discharge to: home  Follow-up Information   Follow up with [facility]. (Please call for a postpartum appointment and visit to get Depo)    Contact information:   [address] [place] [address] [phone]      Newborn Data: Live born female  Birth Weight: 6 lb 1 oz (2750 g) APGAR: 8, 9  Home with mother.  Everlene Other 02/02/2013, 7:52 AM  Hospital Course: [name] [name] is a 27 y.o. female presenting for active labor.  Interview conducted via telephone Pharmacist, community.  Ctx began 3 days ago, have become close together and stronger last night. No loss of fluid or bleeding. Baby moving well.  [facility] for history of 24-week IUFD and 31 week preterm delivery. Has been on 17-P. Polyhydramnios with this pregnancy, but otherwise anatomy normal  and normal AFP (rest of quad screen not done).  Delivery Note  At 10:28 AM a viable female was delivered via Vaginal, Spontaneous Delivery (Presentation: Left Occiput Anterior). APGAR: 8, 9; weight .  Placenta status: Intact, Spontaneous. Cord: 3 vessels with the following complications: None.  Anesthesia: None  Episiotomy:  Lacerations: None  Est. Blood Loss (mL): 500, uterus boggy with large gush immediately postpartum, fundus firm with massage, removal of clots and pitocin IV. Will given cytotec 600 mcg PO for PPH prophylaxis.  Mom to postpartum. Baby to nursery-stable.  FRAZIER,NATALIE  02/01/2013, 10:42 AM  Has done well postpartum.  Seen also by me, agree with note.

## 2013-02-03 NOTE — Discharge Summary (Signed)
Obstetric Discharge Summary Reason for Admission: onset of labor Prenatal Procedures: ultrasound Intrapartum Procedures: spontaneous vaginal delivery Postpartum Procedures: none Complications-Operative and Postpartum: EBL, received cytotec po for pph prophylaxis Hemoglobin  Date Value Range Status  02/01/2013 11.6* 12.0 - 15.0 g/dL Final     HCT  Date Value Range Status  02/01/2013 35.7* 36.0 - 46.0 % Final    Physical Exam:  General: alert, cooperative and no distress Lochia: appropriate Uterine Fundus: firm Incision: n/a DVT Evaluation: No evidence of DVT seen on physical exam. Negative Homan's sign. No cords or calf tenderness. No significant calf/ankle edema.  Discharge Diagnoses: Term Pregnancy-delivered  Discharge Information: Date: 02/03/2013 Activity: pelvic rest Diet: routine Medications: PNV and Ibuprofen Condition: stable Instructions: refer to practice specific booklet Discharge to: home Follow-up Information   Follow up with [facility]. (Please call for a postpartum appointment and visit to get Depo)    Contact information:   [address] [place] [address] [phone]      Newborn Data: Live born female  Birth Weight: 6 lb 1 oz (2750 g) APGAR: 8, 9  Home with mother. Breastfeeding Depo for contraception  Marge Duncans 02/03/2013, 8:57 AM

## 2013-02-03 NOTE — Lactation Note (Signed)
This note was copied from the chart of [name] [name]. Lactation Consultation Note  Patient Name: [name] [name] Today's Date: 02/03/2013 Reason for consult: Follow-up assessment   Maternal Data Formula Feeding for Exclusion: No  Feeding   LATCH Score/Interventions Latch: Grasps breast easily, tongue down, lips flanged, rhythmical sucking.  Audible Swallowing: A few with stimulation  Type of Nipple: Everted at rest and after stimulation  Comfort (Breast/Nipple): Filling, red/small blisters or bruises, mild/mod discomfort  Problem noted: Mild/Moderate discomfort  Hold (Positioning): No assistance needed to correctly position infant at breast. Intervention(s): Support Pillows  LATCH Score: 8  Lactation Tools Discussed/Used     Consult Status Consult Status: Complete  Mom just getting ready to nurse baby when I went in. I assisted mom with pillows and she latched baby with little assistance from me. Reports she is still having a little pain with nursing. Encouraged to rub EBM into nipples after nursing. Reviewed wide open mouth and keeping the baby. No questions at present.To call prn.  Pamelia Hoit 02/03/2013, 8:48 AM

## 2013-02-04 NOTE — Discharge Summary (Signed)
Attestation of Attending Supervision of Advanced Practitioner (CNM/NP): Evaluation and management procedures were performed by the Advanced Practitioner under my supervision and collaboration.  I have reviewed the Advanced Practitioner's note and chart, and I agree with the management and plan.  Yadiel Aubry 02/04/2013 6:28 AM   

## 2013-02-05 ENCOUNTER — Other Ambulatory Visit: Payer: Medicaid Other

## 2013-02-08 ENCOUNTER — Inpatient Hospital Stay (HOSPITAL_COMMUNITY): Payer: Medicaid Other

## 2013-02-09 NOTE — Progress Notes (Signed)
NST on 01/26/13;

## 2013-02-26 ENCOUNTER — Ambulatory Visit (INDEPENDENT_AMBULATORY_CARE_PROVIDER_SITE_OTHER): Payer: Medicaid Other | Admitting: Obstetrics and Gynecology

## 2013-02-26 DIAGNOSIS — Z3042 Encounter for surveillance of injectable contraceptive: Secondary | ICD-10-CM

## 2013-02-26 DIAGNOSIS — Z3049 Encounter for surveillance of other contraceptives: Secondary | ICD-10-CM

## 2013-02-26 MED ORDER — MEDROXYPROGESTERONE ACETATE 150 MG/ML IM SUSP: 150.0000 mg | INTRAMUSCULAR | Status: DC

## 2013-02-26 MED ORDER — MEDROXYPROGESTERONE ACETATE 150 MG/ML IM SUSP
150.0000 mg | INTRAMUSCULAR | Status: DC
  Administered 2013-02-26: 150 mg via INTRAMUSCULAR

## 2013-02-26 NOTE — Addendum Note (Signed)
Addended by: Kathee Delton on: 02/26/2013 03:20 PM   Modules accepted: Orders

## 2013-02-26 NOTE — Progress Notes (Signed)
  Subjective:     [name] [name] is a 27 y.o. female [id number] who presents for a postpartum visit. She is 4 weeks postpartum following a spontaneous vaginal delivery. I have fully reviewed the prenatal and intrapartum course. She was followed at [facility] due to history of stillbirth and 31 week deliveryThe delivery was at 51 gestational weeks. Outcome: spontaneous vaginal delivery. Anesthesia: none. Postpartum course has been uncomplicated. Baby's course has been uncomplicated. Baby is feeding by breast. Bleeding staining only. Bowel function is normal. Bladder function is normal. Patient is not sexually active. Contraception method is abstinence. Postpartum depression screening: negative. No concerns. Reviewed delivery experience, breast-feeding, diet and exercise, Depo-Provera. The following portions of the patient's history were reviewed and updated as appropriate: allergies, current medications, past family history, past medical history, past social history, past surgical history and problem list.  Review of Systems Pertinent items are noted in HPI.   Objective:    BP 103/60  Pulse 68  Temp(Src) 97.2 F (36.2 C) (Oral)  Wt 135 lb 9.6 oz (61.508 kg)  BMI 24.8 kg/m2  General:  alert, cooperative and no distress   Breasts:  inspection negative, no nipple discharge or bleeding, no masses or nodularity palpable  Lungs: clear to auscultation bilaterally  Heart:  regular rate and rhythm, S1, S2 normal, no murmur, click, rub or gallop  Abdomen: soft, non-tender; bowel sounds normal; no masses,  no organomegaly                          Assessment:     4 wks postpartum exam. Pap smear not done at today's visit.   Plan:    1. Contraception: Depo-Provera injections 2. We'll start on Depo-Provera today. 3. Follow up in: 6 months or as needed. pelvic and Pap at next visit.

## 2013-02-26 NOTE — Patient Instructions (Signed)

## 2013-02-26 NOTE — Progress Notes (Signed)
Pt stated that she has not had sexual intercourse since delivery

## 2013-05-14 ENCOUNTER — Ambulatory Visit (INDEPENDENT_AMBULATORY_CARE_PROVIDER_SITE_OTHER): Payer: Medicaid Other | Admitting: General Practice

## 2013-05-14 VITALS — BP 110/79 | HR 80 | Temp 98.1°F | Ht 62.0 in | Wt 136.1 lb

## 2013-05-14 DIAGNOSIS — Z3049 Encounter for surveillance of other contraceptives: Secondary | ICD-10-CM

## 2013-05-14 MED ORDER — MEDROXYPROGESTERONE ACETATE 104 MG/0.65ML ~~LOC~~ SUSP
104.0000 mg | Freq: Once | SUBCUTANEOUS | Status: AC
  Administered 2013-05-14: 104 mg via SUBCUTANEOUS

## 2013-08-06 ENCOUNTER — Ambulatory Visit: Payer: Self-pay

## 2014-06-10 ENCOUNTER — Encounter (HOSPITAL_COMMUNITY): Payer: Self-pay | Admitting: *Deleted

## 2015-08-11 ENCOUNTER — Emergency Department (HOSPITAL_COMMUNITY): Payer: BLUE CROSS/BLUE SHIELD

## 2015-08-11 ENCOUNTER — Encounter (HOSPITAL_COMMUNITY): Payer: Self-pay | Admitting: Emergency Medicine

## 2015-08-11 ENCOUNTER — Emergency Department (HOSPITAL_COMMUNITY)
Admission: EM | Admit: 2015-08-11 | Discharge: 2015-08-11 | Disposition: A | Discharge status: Home / Self Care | Payer: BLUE CROSS/BLUE SHIELD | Attending: Emergency Medicine | Admitting: Emergency Medicine

## 2015-08-11 DIAGNOSIS — Y9389 Activity, other specified: Secondary | ICD-10-CM | POA: Diagnosis not present

## 2015-08-11 DIAGNOSIS — Z793 Long term (current) use of hormonal contraceptives: Secondary | ICD-10-CM | POA: Insufficient documentation

## 2015-08-11 DIAGNOSIS — S29001A Unspecified injury of muscle and tendon of front wall of thorax, initial encounter: Secondary | ICD-10-CM | POA: Insufficient documentation

## 2015-08-11 DIAGNOSIS — Y92410 Unspecified street and highway as the place of occurrence of the external cause: Secondary | ICD-10-CM | POA: Diagnosis not present

## 2015-08-11 DIAGNOSIS — Z3202 Encounter for pregnancy test, result negative: Secondary | ICD-10-CM | POA: Diagnosis not present

## 2015-08-11 DIAGNOSIS — Z79899 Other long term (current) drug therapy: Secondary | ICD-10-CM | POA: Diagnosis not present

## 2015-08-11 DIAGNOSIS — Z8751 Personal history of pre-term labor: Secondary | ICD-10-CM | POA: Insufficient documentation

## 2015-08-11 DIAGNOSIS — Y998 Other external cause status: Secondary | ICD-10-CM | POA: Diagnosis not present

## 2015-08-11 DIAGNOSIS — R079 Chest pain, unspecified: Secondary | ICD-10-CM

## 2015-08-11 LAB — POC URINE PREG, ED: PREG TEST UR: NEGATIVE

## 2015-08-11 MED ORDER — METHOCARBAMOL 500 MG PO TABS: 500.0000 mg | ORAL_TABLET | Freq: Two times a day (BID) | ORAL | Status: DC

## 2015-08-11 MED ORDER — NAPROXEN 500 MG PO TABS: 500.0000 mg | ORAL_TABLET | Freq: Two times a day (BID) | ORAL | Status: DC
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
## 2015-08-11 NOTE — ED Notes (Signed)
Bed: WTR8 Expected date:  Expected time:  Means of arrival:  Comments: EMS-URI
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
## 2016-08-09 NOTE — L&D Delivery Note (Signed)
Patient is 31 y.o. [id number] admitted for IOL for umbilical vein varix. S/p IOL with pitocin. AROM at 1300.  Prenatal course also complicated by mild polyhydramnios, history of IUFD and preterm delivery; she received 17-p injections and was followed closely via MFM.  Delivery Note At 1:51 PM a viable female was delivered via Vaginal, Spontaneous Delivery (Presentation:LOA).  APGAR: 8, 9; weight pending.   Placenta status: spontaneous, intact.  Cord: three vessels  Anesthesia:  IV pain meds Episiotomy: None Lacerations:  Labial  Suture Repair: N/A Est. Blood Loss (mL):  200  Mom to postpartum.  Baby to Couplet care / Skin to Skin.   Upon arrival to room patient was complete and pushing. She pushed with good maternal effort to deliver a viable female infant in cephalic, LOA position. Tight nuchal cord present that was clamped and cut at the perineum. Baby delivered without difficulty, was noted to have good tone and placed on maternal abdomen for oral suctioning, drying and stimulation. Delayed cord clamping performed. Placenta delivered spontaneously with gentle cord traction. Fundus firm with massage and Pitocin. Perineum inspected and found to have superficial labial laceration, which was found to be hemostatic.Counts of sharps, instruments, and lap pads were all correct.   Jearld LeschMary K Renee Beale DO PGY-2 04/13/2017, 2:02 PM

## 2016-09-24 DIAGNOSIS — Z3200 Encounter for pregnancy test, result unknown: Secondary | ICD-10-CM | POA: Diagnosis not present

## 2016-11-04 DIAGNOSIS — Z3482 Encounter for supervision of other normal pregnancy, second trimester: Secondary | ICD-10-CM | POA: Diagnosis not present

## 2016-11-04 DIAGNOSIS — Z3A15 15 weeks gestation of pregnancy: Secondary | ICD-10-CM | POA: Diagnosis not present

## 2016-11-04 DIAGNOSIS — Z8751 Personal history of pre-term labor: Secondary | ICD-10-CM | POA: Diagnosis not present

## 2016-11-04 DIAGNOSIS — O09299 Supervision of pregnancy with other poor reproductive or obstetric history, unspecified trimester: Secondary | ICD-10-CM | POA: Diagnosis not present

## 2016-11-04 DIAGNOSIS — O281 Abnormal biochemical finding on antenatal screening of mother: Secondary | ICD-10-CM | POA: Diagnosis not present

## 2016-11-04 LAB — OB RESULTS CONSOLE PLATELET COUNT: PLATELETS: 240 10*3/uL

## 2016-11-04 LAB — CYTOLOGY - PAP
DRUG SCREEN, URINE: NEGATIVE
GLUCOSE 1 HR PRENATAL, POC: 81 mg/dL
Pap: NEGATIVE
URINE CULTURE, OB: NO GROWTH

## 2016-11-04 LAB — OB RESULTS CONSOLE GC/CHLAMYDIA
CHLAMYDIA, DNA PROBE: NEGATIVE
GC PROBE AMP, GENITAL: NEGATIVE

## 2016-11-04 LAB — OB RESULTS CONSOLE HGB/HCT, BLOOD
HCT: 40 %
Hemoglobin: 12.8 g/dL

## 2016-11-04 LAB — OB RESULTS CONSOLE RPR: RPR: NONREACTIVE

## 2016-11-04 LAB — OB RESULTS CONSOLE VARICELLA ZOSTER ANTIBODY, IGG: VARICELLA IGG: IMMUNE

## 2016-11-30 ENCOUNTER — Ambulatory Visit (INDEPENDENT_AMBULATORY_CARE_PROVIDER_SITE_OTHER): Payer: BLUE CROSS/BLUE SHIELD | Admitting: Obstetrics and Gynecology

## 2016-11-30 ENCOUNTER — Encounter: Payer: Self-pay | Admitting: Obstetrics and Gynecology

## 2016-11-30 VITALS — BP 113/70 | HR 109 | Wt 147.1 lb

## 2016-11-30 DIAGNOSIS — Z3401 Encounter for supervision of normal first pregnancy, first trimester: Secondary | ICD-10-CM

## 2016-11-30 DIAGNOSIS — Z124 Encounter for screening for malignant neoplasm of cervix: Secondary | ICD-10-CM | POA: Diagnosis not present

## 2016-11-30 DIAGNOSIS — Z01419 Encounter for gynecological examination (general) (routine) without abnormal findings: Secondary | ICD-10-CM | POA: Diagnosis not present

## 2016-11-30 DIAGNOSIS — Z113 Encounter for screening for infections with a predominantly sexual mode of transmission: Secondary | ICD-10-CM | POA: Diagnosis not present

## 2016-11-30 DIAGNOSIS — Z8759 Personal history of other complications of pregnancy, childbirth and the puerperium: Secondary | ICD-10-CM

## 2016-11-30 DIAGNOSIS — Z3482 Encounter for supervision of other normal pregnancy, second trimester: Secondary | ICD-10-CM

## 2016-11-30 DIAGNOSIS — O09212 Supervision of pregnancy with history of pre-term labor, second trimester: Secondary | ICD-10-CM

## 2016-11-30 NOTE — Progress Notes (Signed)
   PRENATAL VISIT NOTE  Subjective:  [name] [name] is a 31 y.o. [id number] at [redacted]w[redacted]d being seen today for initial prenatal care.  She is currently monitored for the following issues for this high-risk pregnancy and has Pregnancy with history of pre-term labor; History of stillbirth; and Supervision of high-risk pregnancy on her problem list.  Patient reports no complaints.   .  .  Movement: Absent. Denies leaking of fluid.   The following portions of the patient's history were reviewed and updated as appropriate: allergies, current medications, past family history, past medical history, past social history, past surgical history and problem list. Problem list updated.  Objective:   Vitals:   11/30/16 0814  BP: 113/70  Pulse: (!) 109  Weight: 147 lb 1.6 oz (66.7 kg)    Fetal Status: Fetal Heart Rate (bpm): 159   Movement: Absent     General:  Alert, oriented and cooperative. Patient is in no acute distress.  Skin: Skin is warm and dry. No rash noted.   Cardiovascular: Normal heart rate noted  Respiratory: Normal respiratory effort, no problems with respiration noted  Abdomen: Soft, gravid, appropriate for gestational age. Pain/Pressure: Absent     Pelvic:  Large friable ectropion. Bleeding after collection of PAP. large amount of mucos present in os. cervix appeared closed.         Extremities: Normal range of motion.     Mental Status: Normal mood and affect. Normal behavior. Normal judgment and thought content.   Assessment and Plan:  Pregnancy: [id number] at [redacted]w[redacted]d  1. Encounter for supervision of normal first pregnancy in first trimester Pap today patient would like genetic testing. - outside of window for quad - refer to MFM for genetic counseling and possible NIPS - Cytology - PAP - Culture, OB Urine - Prenatal Profile I - HIV antibody - [place] MFM OB COMP + 14 WK; Future - AMB referral to maternal fetal medicine  2. History of stillbirth refere to MFM for counseling and possible  NIPS  3. History of pre-term deivery -start paperwork for 17-P at this time.   Preterm labor symptoms and general obstetric precautions including but not limited to vaginal bleeding, contractions, leaking of fluid and fetal movement were reviewed in detail with the patient. Please refer to After Visit Summary for other counseling recommendations.  Return in about 4 weeks (around 12/28/2016) for HROB.   Lorne Skeens, MD

## 2016-11-30 NOTE — Patient Instructions (Signed)

## 2016-11-30 NOTE — Progress Notes (Signed)
New ob packet given  Pap smear due today Flu Shot given

## 2016-12-01 LAB — PRENATAL PROFILE I(LABCORP)
Antibody Screen: NEGATIVE
Basophils Absolute: 0 10*3/uL (ref 0.0–0.2)
Basos: 0 %
EOS (ABSOLUTE): 0.2 10*3/uL (ref 0.0–0.4)
Eos: 2 %
Hematocrit: 39.6 % (ref 34.0–46.6)
Hemoglobin: 12.9 g/dL (ref 11.1–15.9)
Hepatitis B Surface Ag: NEGATIVE
IMMATURE GRANULOCYTES: 0 %
Immature Grans (Abs): 0 10*3/uL (ref 0.0–0.1)
LYMPHS ABS: 1 10*3/uL (ref 0.7–3.1)
Lymphs: 9 %
MCH: 28.2 pg (ref 26.6–33.0)
MCHC: 32.6 g/dL (ref 31.5–35.7)
MCV: 87 fL (ref 79–97)
Monocytes Absolute: 0.7 10*3/uL (ref 0.1–0.9)
Monocytes: 7 %
NEUTROS PCT: 82 %
Neutrophils Absolute: 9 10*3/uL — ABNORMAL HIGH (ref 1.4–7.0)
PLATELETS: 212 10*3/uL (ref 150–379)
RBC: 4.57 x10E6/uL (ref 3.77–5.28)
RDW: 18.5 % — ABNORMAL HIGH (ref 12.3–15.4)
RPR Ser Ql: NONREACTIVE
Rh Factor: POSITIVE
Rubella Antibodies, IGG: <0.9 index — ABNORMAL LOW (ref >0.99)
WBC: 10.9 10*3/uL — ABNORMAL HIGH (ref 3.4–10.8)

## 2016-12-01 LAB — HIV ANTIBODY (ROUTINE TESTING W REFLEX): HIV Screen 4th Generation wRfx: NONREACTIVE

## 2016-12-02 ENCOUNTER — Encounter: Payer: Self-pay | Admitting: *Deleted

## 2016-12-02 LAB — CYTOLOGY - PAP
CHLAMYDIA, DNA PROBE: NEGATIVE
Diagnosis: NEGATIVE
NEISSERIA GONORRHEA: NEGATIVE

## 2016-12-02 LAB — CULTURE, OB URINE

## 2016-12-02 LAB — URINE CULTURE, OB REFLEX: Organism ID, Bacteria: NO GROWTH

## 2016-12-06 ENCOUNTER — Encounter: Payer: Self-pay | Admitting: Obstetrics and Gynecology

## 2016-12-08 ENCOUNTER — Ambulatory Visit (HOSPITAL_COMMUNITY): Payer: BLUE CROSS/BLUE SHIELD

## 2016-12-14 ENCOUNTER — Encounter (HOSPITAL_COMMUNITY): Payer: Self-pay

## 2016-12-14 ENCOUNTER — Other Ambulatory Visit: Payer: Self-pay | Admitting: Obstetrics and Gynecology

## 2016-12-14 ENCOUNTER — Ambulatory Visit (HOSPITAL_COMMUNITY)
Admission: RE | Admit: 2016-12-14 | Discharge: 2016-12-14 | Disposition: A | Discharge status: Home / Self Care | Payer: BLUE CROSS/BLUE SHIELD | Source: Ambulatory Visit | Attending: Obstetrics and Gynecology | Admitting: Obstetrics and Gynecology

## 2016-12-14 DIAGNOSIS — Z3687 Encounter for antenatal screening for uncertain dates: Secondary | ICD-10-CM | POA: Insufficient documentation

## 2016-12-14 DIAGNOSIS — O09212 Supervision of pregnancy with history of pre-term labor, second trimester: Secondary | ICD-10-CM | POA: Diagnosis not present

## 2016-12-14 DIAGNOSIS — Z3686 Encounter for antenatal screening for cervical length: Secondary | ICD-10-CM

## 2016-12-14 DIAGNOSIS — Z3689 Encounter for other specified antenatal screening: Secondary | ICD-10-CM | POA: Diagnosis not present

## 2016-12-14 DIAGNOSIS — O0932 Supervision of pregnancy with insufficient antenatal care, second trimester: Secondary | ICD-10-CM | POA: Diagnosis not present

## 2016-12-14 DIAGNOSIS — O09292 Supervision of pregnancy with other poor reproductive or obstetric history, second trimester: Secondary | ICD-10-CM | POA: Insufficient documentation

## 2016-12-14 DIAGNOSIS — Z3401 Encounter for supervision of normal first pregnancy, first trimester: Secondary | ICD-10-CM

## 2016-12-14 DIAGNOSIS — Z3A21 21 weeks gestation of pregnancy: Secondary | ICD-10-CM | POA: Diagnosis not present

## 2016-12-14 DIAGNOSIS — Z3A19 19 weeks gestation of pregnancy: Secondary | ICD-10-CM | POA: Diagnosis not present

## 2016-12-14 NOTE — Consult Note (Signed)
Maternal Fetal Medicine Consultation  Requesting Provider(s): Ernestina PennaNicholas Schenk, MD  Reason for consultation: Hx of previous 24 week IUFD ? toxoplasmosis  HPI:  [name] [name] is a 31 yo [phone] currently at 21w 0d  weeks who was seen for consultation today due to a history of a 24 week fetal demise as well as a previous preterm delivery at 31 weeks.  The patient reports that she presented for her routine clinic visit at 24 weeks and was noted to have an IUFD.  Ultrasound examination at the time of diagnosis revealed fetal ascites.  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  Unfortunately, confirmatory testing was not performed at a reference lab.  Autopsy was not performed.  Placental pathology revealed mild chorioamnionitis but was otherwise within normal limits.  The remainder of the work up for the IUFD was negative.  The patient completed an APS work up with her last pregnancy that was within normal limits.  Since her IUFD, the patient has had a normal term uncomplicated SVD.  Ms. [name] [name]'s first delivery was a spontaneous preterm delivery at 1931 weeks in [place].  Her currently pregnancy is uncomplicated.  She is currently getting weekly 17-P injections.  She is without complaints today.  OB History: OB History    Gravida Para Term Preterm AB Living   4 3 1 2  0 2   SAB TAB Ectopic Multiple Live Births   0 0 0 0 2      PMH:  Past Medical History:  Diagnosis Date  . No pertinent past medical history   . Preterm labor     PSH:  Past Surgical History:  Procedure Laterality Date  . NO PAST SURGERIES     Meds:  Current Outpatient Prescriptions on File Prior to Encounter  Medication Sig Dispense Refill  . Prenatal Vit-Fe Fumarate-FA (PRENATAL VITAMINS PLUS) 27-1 MG TABS Take 1 tablet by mouth daily. 30 tablet 2   No current facility-administered medications on file prior to encounter.    Allergies: No Known Allergies   FH:  Family History  Problem  Relation Age of Onset  . Kidney disease Father    Soc:  Social History   Social History  . Marital status: Married    Spouse name: N/A  . Number of children: N/A  . Years of education: N/A   Occupational History  . Not on file.   Social History Main Topics  . Smoking status: Never Smoker  . Smokeless tobacco: Never Used  . Alcohol use No  . Drug use: No  . Sexual activity: Not Currently    Birth control/ protection: None     Comment: delivered fetal demise 08/04/11   Other Topics Concern  . Not on file   Social History Narrative  . No narrative on file    Review of Systems: no vaginal bleeding or cramping/contractions, no LOF, no nausea/vomiting. All other systems reviewed and are negative.  PE:  151.6, 98/68, 91  GEN: well-appearing female ABD: gravid, NT  Ultrasound: Single IUP at 19w 6d History of previous 24 week IUFD, hx of previous 31 week spontaneous preterm delivery Normal detailed fetal anatomy Posterior placenta without previa Normal amniotic fluid volume  TVUS - cervical length 3.1 cm without funneling or dynamic changes  A/P: 1) Single IUP 21w 0d  2) History of previous preterm delivery at 31 weeks - patient is a candidate for 17-P injections.  Additionally, will screen with serial cervical length ultrasounds  until at least [redacted] weeks gestation.  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 [facility]) it is difficult to determine whether or not this was the cause of the prior IUFD.  Toxoplasmosis IgM may stay positive for a median time of 10-13 months so therefore does not necessarily mean that a recent infection occurred during the course of the pregnancy.  Even with a very elevated toxoplasmosis IgG, it is difficult to determine when the infection actually occurred.Typical ultrasound findings associated with fetal  toxoplasmosis infection include cerebral ventriculomegaly, cerebral calcifications and liver hyperechoic densities.  Less common findings include ascites, pleural/ pericardial effusions and thickened placenta.  Fetal demise is actually a rare occurrence with fetal toxoplasmosis infection.  Fortunately, congenital toxoplasmosis due to reinfection is extremely rare - has only been reported in atypical toxoplasmosis strains.   Recommendations 1) No further work up needed due to previous IUFD (unexplained at this point).  Had normal APS work up with her last pregnancy. 2) Recommend serial ultrasounds for growth given history of previous IUFD 3) Recommend 17-P injections beginning at 16-20 weeks and weekly until [redacted] weeks gestation 4) Cervical length screening ultrasounds every 1-2 weeks until [redacted] weeks gestation   Thank you for the opportunity to be a part of the care of [name] [name]. Please contact our office if we can be of further assistance.   I spent approximately 30 minutes with this patient with over 50% of time spent in face-to-face counseling.  Alpha Gula, MD Maternal Fetal Medicine

## 2016-12-15 ENCOUNTER — Other Ambulatory Visit (HOSPITAL_COMMUNITY): Payer: Self-pay | Admitting: *Deleted

## 2016-12-15 DIAGNOSIS — O09299 Supervision of pregnancy with other poor reproductive or obstetric history, unspecified trimester: Secondary | ICD-10-CM

## 2016-12-15 DIAGNOSIS — O09219 Supervision of pregnancy with history of pre-term labor, unspecified trimester: Secondary | ICD-10-CM

## 2016-12-23 ENCOUNTER — Telehealth: Payer: Self-pay | Admitting: *Deleted

## 2016-12-23 DIAGNOSIS — O09212 Supervision of pregnancy with history of pre-term labor, second trimester: Secondary | ICD-10-CM | POA: Diagnosis not present

## 2016-12-23 NOTE — Telephone Encounter (Signed)
[facility] patient [employer] shipment. The office will ship out tomorrow.

## 2016-12-23 NOTE — Telephone Encounter (Signed)
Received message left on nurse voicemail on 12/22/16 at 1400.  [name] [name] from [facility].  States she needs a return call to schedule shipment of Makena.  Return call to [phone].

## 2016-12-24 ENCOUNTER — Telehealth: Payer: Self-pay | Admitting: *Deleted

## 2016-12-24 NOTE — Telephone Encounter (Signed)
Attempted to contact patient using [place] Burmese interpreter # [phone]. There was no answer. Left message stating I am calling to notify her that we have received her Makena and need to schedule a time to come in for her first injection. She can return my call at the clinic. Will try to call again later.

## 2016-12-27 NOTE — Telephone Encounter (Signed)
Per chart review, pt has scheduled office appt tomorrow for prenatal visit - can receive 17P @ that visit.

## 2016-12-28 ENCOUNTER — Ambulatory Visit (HOSPITAL_COMMUNITY)
Admission: RE | Admit: 2016-12-28 | Discharge: 2016-12-28 | Disposition: A | Discharge status: Home / Self Care | Payer: BLUE CROSS/BLUE SHIELD | Source: Ambulatory Visit | Attending: Obstetrics and Gynecology | Admitting: Obstetrics and Gynecology

## 2016-12-28 ENCOUNTER — Encounter (HOSPITAL_COMMUNITY): Payer: Self-pay

## 2016-12-28 ENCOUNTER — Ambulatory Visit (INDEPENDENT_AMBULATORY_CARE_PROVIDER_SITE_OTHER): Payer: BLUE CROSS/BLUE SHIELD | Admitting: Obstetrics and Gynecology

## 2016-12-28 VITALS — BP 109/52 | HR 95 | Wt 149.7 lb

## 2016-12-28 DIAGNOSIS — Z3A21 21 weeks gestation of pregnancy: Secondary | ICD-10-CM | POA: Insufficient documentation

## 2016-12-28 DIAGNOSIS — O09219 Supervision of pregnancy with history of pre-term labor, unspecified trimester: Secondary | ICD-10-CM | POA: Diagnosis not present

## 2016-12-28 DIAGNOSIS — O0992 Supervision of high risk pregnancy, unspecified, second trimester: Secondary | ICD-10-CM

## 2016-12-28 DIAGNOSIS — Z283 Underimmunization status: Secondary | ICD-10-CM

## 2016-12-28 DIAGNOSIS — O0990 Supervision of high risk pregnancy, unspecified, unspecified trimester: Secondary | ICD-10-CM

## 2016-12-28 DIAGNOSIS — O09212 Supervision of pregnancy with history of pre-term labor, second trimester: Secondary | ICD-10-CM | POA: Diagnosis not present

## 2016-12-28 DIAGNOSIS — O9989 Other specified diseases and conditions complicating pregnancy, childbirth and the puerperium: Secondary | ICD-10-CM

## 2016-12-28 DIAGNOSIS — Z2839 Other underimmunization status: Secondary | ICD-10-CM

## 2016-12-28 DIAGNOSIS — O09899 Supervision of other high risk pregnancies, unspecified trimester: Secondary | ICD-10-CM | POA: Insufficient documentation

## 2016-12-28 MED ORDER — HYDROXYPROGESTERONE CAPROATE 275 MG/1.1ML ~~LOC~~ SOAJ
275.0000 mg | Freq: Once | SUBCUTANEOUS | Status: AC
  Administered 2016-12-28: 275 mg via SUBCUTANEOUS

## 2016-12-28 NOTE — Progress Notes (Signed)
Video Interpreter # [phone]  Educated pt on Benefits of Breastfeeding for [employer]

## 2016-12-28 NOTE — Progress Notes (Signed)
   PRENATAL VISIT NOTE  Subjective:  [name] [name] is a 31 y.o. [id number] at 7125w0d being seen today for ongoing prenatal care.  She is currently monitored for the following issues for this high-risk pregnancy and has Pregnancy with history of pre-term labor; History of stillbirth; Supervision of high risk pregnancy, antepartum; and Rubella non-immune status, antepartum on her problem list.  Patient reports no complaints.  Contractions: Irritability. Vag. Bleeding: None.  Movement: Present. Denies leaking of fluid.   The following portions of the patient's history were reviewed and updated as appropriate: allergies, current medications, past family history, past medical history, past social history, past surgical history and problem list. Problem list updated.  Objective:   Vitals:   12/28/16 0820  BP: (!) 109/52  Pulse: 95  Weight: 149 lb 11.2 oz (67.9 kg)    Fetal Status: Fetal Heart Rate (bpm): 152   Movement: Present     General:  Alert, oriented and cooperative. Patient is in no acute distress.  Skin: Skin is warm and dry. No rash noted.   Cardiovascular: Normal heart rate noted  Respiratory: Normal respiratory effort, no problems with respiration noted  Abdomen: Soft, gravid, appropriate for gestational age. Pain/Pressure: Present     Pelvic:  Cervical exam deferred        Extremities: Normal range of motion.  Edema: None  Mental Status: Normal mood and affect. Normal behavior. Normal judgment and thought content.   Assessment and Plan:  Pregnancy: [id number] at 3325w0d  1. Rubella non-immune status, antepartum -immunize post partum  2. Current pregnancy with history of pre-term labor in second trimester Start makena today  Cervical lengths ordered  3. Supervision of high risk pregnancy, antepartum Up to date 2hr gtt next visit with 28 wk labs  Preterm labor symptoms and general obstetric precautions including but not limited to vaginal bleeding, contractions, leaking of fluid and  fetal movement were reviewed in detail with the patient. Please refer to After Visit Summary for other counseling recommendations.  Return in about 4 weeks (around 01/25/2017), or for 2hr gtt adn 28 wk labs, weekly 17-P.   Ernestina PennaNicholas Irbin Fines, MD

## 2016-12-28 NOTE — Patient Instructions (Signed)
Hydroxyprogesterone solution for injection What is this medicine? HYDROXYPROGESTERONE (hye drox ee proe JES ter one) is a female hormone. This medicine is used in women who are pregnant and who have delivered a baby too early (preterm) in the past. It helps lower the risk of having a preterm baby again. This medicine may be used for other purposes; ask your health care provider or pharmacist if you have questions. COMMON BRAND NAME(S): Makena What should I tell my health care provider before I take this medicine? They need to know if you have any of these conditions: -blood clotting disorders -breast, cervical, uterine, or vaginal cancer -depression -diabetes or prediabetes -heart disease -high blood pressure -kidney disease -liver disease -lung or breathing disease, like asthma -migraine headaches -seizures -vaginal bleeding -an unusual or allergic reaction to hydroxyprogesterone, other hormones, medicines, foods, dyes, castor oil, benzyl alcohol, or other preservatives -breast-feeding How should I use this medicine? This medicine is for injection into a muscle. It is given by a health care professional in a hospital or clinic setting. You are likely to get an injection once a week to prevent preterm delivery. Talk to your pediatrician regarding the use of this medicine in children. Special care may be needed. Overdosage: If you think you have taken too much of this medicine contact a poison control center or emergency room at once. NOTE: This medicine is only for you. Do not share this medicine with others. What if I miss a dose? It is important not to miss your dose. Call your doctor or health care professional if you are unable to keep an appointment. What may interact with this medicine? -acetaminophen -bupropion -clozapine -efavirenz -halothane -methadone -nicotine -theophylline, aminophylline -tizanidine This list may not describe all possible interactions. Give your  health care provider a list of all the medicines, herbs, non-prescription drugs, or dietary supplements you use. Also tell them if you smoke, drink alcohol, or use illegal drugs. Some items may interact with your medicine. What should I watch for while using this medicine? Your condition will be monitored carefully while you are receiving this medicine. What side effects may I notice from receiving this medicine? Side effects that you should report to your doctor or health care professional as soon as possible: -allergic reactions like skin rash, itching or hives, swelling of the face, lips, or tongue -breathing problems -breast tissue changes or discharge -changes in vision -confusion, trouble speaking or understanding -depressed mood -increased hunger or thirst -increased urination -pain, redness, or irritation at site where injected -pain, swelling, warmth in the leg -shortness of breath, chest pain, swelling in a leg -sudden numbness or weakness of the face, arm or leg -sudden severe headaches -trouble walking, dizziness, loss of balance or coordination -unusually weak or tired -vaginal bleeding -yellowing of the eyes or skin Side effects that usually do not require medical attention (report to your doctor or health care professional if they continue or are bothersome): -changes in emotions or moods -diarrhea -fluid retention and swelling -nausea This list may not describe all possible side effects. Call your doctor for medical advice about side effects. You may report side effects to FDA at 1-800-FDA-1088. Where should I keep my medicine? This drug is given in a hospital or clinic and will not be stored at home. NOTE: This sheet is a summary. It may not cover all possible information. If you have questions about this medicine, talk to your doctor, pharmacist, or health care provider.  2018 Elsevier/Gold Standard (2009-09-16 11:17:12)  

## 2017-01-04 ENCOUNTER — Ambulatory Visit (INDEPENDENT_AMBULATORY_CARE_PROVIDER_SITE_OTHER): Payer: BLUE CROSS/BLUE SHIELD | Admitting: *Deleted

## 2017-01-04 VITALS — BP 112/69 | HR 91 | Wt 151.0 lb

## 2017-01-04 DIAGNOSIS — O09212 Supervision of pregnancy with history of pre-term labor, second trimester: Secondary | ICD-10-CM | POA: Diagnosis not present

## 2017-01-04 MED ORDER — HYDROXYPROGESTERONE CAPROATE 275 MG/1.1ML ~~LOC~~ SOAJ
275.0000 mg | Freq: Once | SUBCUTANEOUS | Status: AC
  Administered 2017-01-04: 275 mg via SUBCUTANEOUS

## 2017-01-07 ENCOUNTER — Other Ambulatory Visit: Payer: Self-pay | Admitting: Family Medicine

## 2017-01-11 ENCOUNTER — Ambulatory Visit (HOSPITAL_COMMUNITY)
Admission: RE | Admit: 2017-01-11 | Discharge: 2017-01-11 | Disposition: A | Discharge status: Home / Self Care | Payer: BLUE CROSS/BLUE SHIELD | Source: Ambulatory Visit | Attending: Obstetrics and Gynecology | Admitting: Obstetrics and Gynecology

## 2017-01-11 ENCOUNTER — Encounter (HOSPITAL_COMMUNITY): Payer: Self-pay

## 2017-01-11 ENCOUNTER — Other Ambulatory Visit (HOSPITAL_COMMUNITY): Payer: Self-pay | Admitting: Maternal and Fetal Medicine

## 2017-01-11 ENCOUNTER — Ambulatory Visit (INDEPENDENT_AMBULATORY_CARE_PROVIDER_SITE_OTHER): Payer: BLUE CROSS/BLUE SHIELD | Admitting: *Deleted

## 2017-01-11 VITALS — BP 104/68 | HR 86 | Wt 154.4 lb

## 2017-01-11 DIAGNOSIS — O09212 Supervision of pregnancy with history of pre-term labor, second trimester: Secondary | ICD-10-CM

## 2017-01-11 DIAGNOSIS — Z362 Encounter for other antenatal screening follow-up: Secondary | ICD-10-CM | POA: Diagnosis not present

## 2017-01-11 DIAGNOSIS — O09299 Supervision of pregnancy with other poor reproductive or obstetric history, unspecified trimester: Secondary | ICD-10-CM

## 2017-01-11 DIAGNOSIS — O09292 Supervision of pregnancy with other poor reproductive or obstetric history, second trimester: Secondary | ICD-10-CM | POA: Insufficient documentation

## 2017-01-11 DIAGNOSIS — O09219 Supervision of pregnancy with history of pre-term labor, unspecified trimester: Secondary | ICD-10-CM

## 2017-01-11 DIAGNOSIS — Z3686 Encounter for antenatal screening for cervical length: Secondary | ICD-10-CM | POA: Diagnosis not present

## 2017-01-11 DIAGNOSIS — Z3A23 23 weeks gestation of pregnancy: Secondary | ICD-10-CM

## 2017-01-11 DIAGNOSIS — O09899 Supervision of other high risk pregnancies, unspecified trimester: Secondary | ICD-10-CM

## 2017-01-11 MED ORDER — HYDROXYPROGESTERONE CAPROATE 275 MG/1.1ML ~~LOC~~ SOAJ
275.0000 mg | Freq: Once | SUBCUTANEOUS | Status: AC
  Administered 2017-01-11: 275 mg via SUBCUTANEOUS

## 2017-01-11 NOTE — Progress Notes (Signed)
[name] [name] 275 mg SQ given via auto injector to Rt arm. Pt tolerated well.  She denies problems from previous [name] [name] injections.

## 2017-01-18 ENCOUNTER — Ambulatory Visit (INDEPENDENT_AMBULATORY_CARE_PROVIDER_SITE_OTHER): Payer: BLUE CROSS/BLUE SHIELD | Admitting: *Deleted

## 2017-01-18 VITALS — BP 110/71 | HR 91 | Wt 156.6 lb

## 2017-01-18 DIAGNOSIS — O09212 Supervision of pregnancy with history of pre-term labor, second trimester: Secondary | ICD-10-CM | POA: Diagnosis not present

## 2017-01-18 MED ORDER — HYDROXYPROGESTERONE CAPROATE 275 MG/1.1ML ~~LOC~~ SOAJ
275.0000 mg | Freq: Once | SUBCUTANEOUS | Status: AC
  Administered 2017-01-18: 275 mg via SUBCUTANEOUS

## 2017-01-18 NOTE — Progress Notes (Signed)
Makena 275 mg SQ auto injector administered as scheduled.  Pt tolerated well. Next injection on 6/21 as scheduled

## 2017-01-25 ENCOUNTER — Ambulatory Visit (HOSPITAL_COMMUNITY): Payer: BLUE CROSS/BLUE SHIELD

## 2017-01-25 ENCOUNTER — Ambulatory Visit: Payer: BLUE CROSS/BLUE SHIELD

## 2017-01-27 ENCOUNTER — Ambulatory Visit (INDEPENDENT_AMBULATORY_CARE_PROVIDER_SITE_OTHER): Payer: BLUE CROSS/BLUE SHIELD | Admitting: Obstetrics and Gynecology

## 2017-01-27 VITALS — BP 112/76 | HR 104 | Wt 151.0 lb

## 2017-01-27 DIAGNOSIS — Z8759 Personal history of other complications of pregnancy, childbirth and the puerperium: Secondary | ICD-10-CM

## 2017-01-27 DIAGNOSIS — O0992 Supervision of high risk pregnancy, unspecified, second trimester: Secondary | ICD-10-CM

## 2017-01-27 DIAGNOSIS — O09212 Supervision of pregnancy with history of pre-term labor, second trimester: Secondary | ICD-10-CM

## 2017-01-27 DIAGNOSIS — O0990 Supervision of high risk pregnancy, unspecified, unspecified trimester: Secondary | ICD-10-CM

## 2017-01-27 MED ORDER — HYDROXYPROGESTERONE CAPROATE 275 MG/1.1ML ~~LOC~~ SOAJ
275.0000 mg | Freq: Once | SUBCUTANEOUS | Status: AC
  Administered 2017-01-27: 275 mg via SUBCUTANEOUS

## 2017-01-27 NOTE — Progress Notes (Addendum)
Called [facility] for refill of patients makena, last dose given today. Spoke with representative who stated that they have to speak with patient regarding her makena shipment as she does have a large copay. Advised rep pt non english speaking and they stated that they can get an interpreter on the line when she calls. She should call [phone]. Called patient and left message that she needs to call the pharmacy at number above to discuss getting her Makena.

## 2017-01-27 NOTE — Progress Notes (Signed)
Pt not fasting today will come in next week fasting for her 2hr and 28 week labs/tdap.

## 2017-01-27 NOTE — Addendum Note (Signed)
Addended by: Sherre LainASH, Reagen Goates A on: 01/27/2017 08:23 AM   Modules accepted: Orders

## 2017-01-27 NOTE — Progress Notes (Signed)
Subjective:  [name] [name] is a 31 y.o. [id number] at 7023w2d being seen today for ongoing prenatal care.  She is currently monitored for the following issues for this high-risk pregnancy and has Pregnancy with history of pre-term labor; History of stillbirth; Supervision of high risk pregnancy, antepartum; and Rubella non-immune status, antepartum on her problem list.  Patient reports no complaints.  Contractions: Irritability. Vag. Bleeding: None.  Movement: Present. Denies leaking of fluid.   The following portions of the patient's history were reviewed and updated as appropriate: allergies, current medications, past family history, past medical history, past social history, past surgical history and problem list. Problem list updated.  Objective:   Vitals:   01/27/17 0803  BP: 112/76  Pulse: (!) 104  Weight: 151 lb (68.5 kg)    Fetal Status: Fetal Heart Rate (bpm): 150   Movement: Present     General:  Alert, oriented and cooperative. Patient is in no acute distress.  Skin: Skin is warm and dry. No rash noted.   Cardiovascular: Normal heart rate noted  Respiratory: Normal respiratory effort, no problems with respiration noted  Abdomen: Soft, gravid, appropriate for gestational age. Pain/Pressure: Absent     Pelvic:  Cervical exam deferred        Extremities: Normal range of motion.  Edema: None  Mental Status: Normal mood and affect. Normal behavior. Normal judgment and thought content.   Urinalysis:      Assessment and Plan:  Pregnancy: [id number] at 3723w2d  1. Supervision of high risk pregnancy, antepartum Stable Glucola next week  2. History of stillbirth   3. Current pregnancy with history of pre-term labor in second trimester CL stable Continue with 17 OHP  Preterm labor symptoms and general obstetric precautions including but not limited to vaginal bleeding, contractions, leaking of fluid and fetal movement were reviewed in detail with the patient. Please refer to After Visit  Summary for other counseling recommendations.  Return in about 2 weeks (around 02/10/2017) for in 1 week for 28 week labs and 2hr/17P Pt to be fasting needs morning appt. , OB visit.   Hermina StaggersErvin, Michael L, MD

## 2017-02-03 ENCOUNTER — Other Ambulatory Visit: Payer: BLUE CROSS/BLUE SHIELD

## 2017-02-03 DIAGNOSIS — O09213 Supervision of pregnancy with history of pre-term labor, third trimester: Secondary | ICD-10-CM | POA: Diagnosis not present

## 2017-02-03 MED ORDER — TETANUS-DIPHTH-ACELL PERTUSSIS 5-2.5-18.5 LF-MCG/0.5 IM SUSP
0.5000 mL | Freq: Once | INTRAMUSCULAR | Status: AC
  Administered 2017-02-17: 0.5 mL via INTRAMUSCULAR

## 2017-02-03 NOTE — Progress Notes (Signed)
Pt was unable to receive Makena today because we did not have in office.  Contacted Makena while pt was here during the visit in regards to receiving a shipment of next dose.  Pt spoke with compound pharmacist with Burmese interpreter.  Pt was advised to contact [facility] @ [phone] so that she could give permission to receive charity to help with the cost of medication due to high deductible for medication.  Because Makena had several failed attempts in contacting the pt, Makena cancelled her order and are now having re-authorize medication again.  Pt was advised to contact Makena care in regards to giving them authorization.  Pt stated that she would take the number and have someone from her home who speaks English to contact Makena.

## 2017-02-04 LAB — CBC
HEMATOCRIT: 41 % (ref 34.0–46.6)
HEMOGLOBIN: 13.6 g/dL (ref 11.1–15.9)
MCH: 30 pg (ref 26.6–33.0)
MCHC: 33.2 g/dL (ref 31.5–35.7)
MCV: 90 fL (ref 79–97)
Platelets: 181 10*3/uL (ref 150–379)
RBC: 4.54 x10E6/uL (ref 3.77–5.28)
RDW: 15.5 % — ABNORMAL HIGH (ref 12.3–15.4)
WBC: 6.5 10*3/uL (ref 3.4–10.8)

## 2017-02-04 LAB — HIV ANTIBODY (ROUTINE TESTING W REFLEX): HIV Screen 4th Generation wRfx: NONREACTIVE

## 2017-02-04 LAB — GLUCOSE TOLERANCE, 2 HOURS W/ 1HR
GLUCOSE, FASTING: 70 mg/dL (ref 65–91)
Glucose, 1 hour: 80 mg/dL (ref 65–179)
Glucose, 2 hour: 68 mg/dL (ref 65–152)

## 2017-02-04 LAB — RPR: RPR: NONREACTIVE

## 2017-02-10 ENCOUNTER — Ambulatory Visit: Payer: BLUE CROSS/BLUE SHIELD | Admitting: *Deleted

## 2017-02-10 VITALS — BP 98/77 | HR 112

## 2017-02-10 DIAGNOSIS — O09213 Supervision of pregnancy with history of pre-term labor, third trimester: Principal | ICD-10-CM

## 2017-02-10 DIAGNOSIS — O09893 Supervision of other high risk pregnancies, third trimester: Secondary | ICD-10-CM

## 2017-02-10 NOTE — Progress Notes (Addendum)
Here for injection. C/o contractions every hour or two at night. Discussed preterm labor signs and symptoms, Voices understanding. At this time we still have not received makena for this patient. See note from last visit 02/03/17. I called [facility] at [phone]- they connected me to [facility] [phone]. They state she is approved but has high copay. I explained from what I see in her chart and talking to patient she was trying to get help with charity care for copays. I asked how we can help to facilitate this patient receiving her makena shots.  She states she does not see that in the chart patient spoke with someone about charity care but that can be done today while she is in our office and she got a Burmese interpreter on the line to talk to patient. Per [facility] they informed patient she has to call Makena at the number they gave her to ask for copay assistance. Then she has to call [facility] back and give them an Id number, etc.  I called [employer] Interpreter and asked them to dial the number for Makena [phone] and after I introduce her they will interpret for her.  Addendum 1:38pm I spoke with [facility] coordinator Tiffany and she states Laurey was approved for copay assistance. She will have a $50 per month copay. She gave me her approval information:  ID# [id number]. Bin # [id number]  PCN# [id number]  Group # [id number] and said I could call [facility] and give them that information.    I called [facility] and gave them all the above information. She stated this information would go to the appropriate person and they would run everything thru  And call us back to discuss delivery information.

## 2017-02-11 ENCOUNTER — Encounter: Payer: BLUE CROSS/BLUE SHIELD | Admitting: Obstetrics & Gynecology

## 2017-02-17 ENCOUNTER — Ambulatory Visit (INDEPENDENT_AMBULATORY_CARE_PROVIDER_SITE_OTHER): Payer: BLUE CROSS/BLUE SHIELD | Admitting: Obstetrics and Gynecology

## 2017-02-17 VITALS — BP 106/69 | HR 90 | Wt 163.0 lb

## 2017-02-17 DIAGNOSIS — Z8759 Personal history of other complications of pregnancy, childbirth and the puerperium: Secondary | ICD-10-CM

## 2017-02-17 DIAGNOSIS — O0993 Supervision of high risk pregnancy, unspecified, third trimester: Secondary | ICD-10-CM

## 2017-02-17 DIAGNOSIS — Z23 Encounter for immunization: Secondary | ICD-10-CM | POA: Diagnosis not present

## 2017-02-17 DIAGNOSIS — O09213 Supervision of pregnancy with history of pre-term labor, third trimester: Secondary | ICD-10-CM

## 2017-02-17 DIAGNOSIS — O0990 Supervision of high risk pregnancy, unspecified, unspecified trimester: Secondary | ICD-10-CM

## 2017-02-17 NOTE — Progress Notes (Signed)
Interpreter  Rosita FireYe [phone]

## 2017-02-17 NOTE — Progress Notes (Signed)
Subjective:  [name] [name] is a 31 y.o. [id number] at 2673w2d being seen today for ongoing prenatal care.  She is currently monitored for the following issues for this high-risk pregnancy and has Pregnancy with history of pre-term labor; History of stillbirth; Supervision of high risk pregnancy, antepartum; and Rubella non-immune status, antepartum on her problem list.  Patient reports no complaints.  Contractions: Irritability. Vag. Bleeding: None.  Movement: Present. Denies leaking of fluid.   The following portions of the patient's history were reviewed and updated as appropriate: allergies, current medications, past family history, past medical history, past social history, past surgical history and problem list. Problem list updated.  Objective:   Vitals:   02/17/17 0749  BP: 106/69  Pulse: 90  Weight: 163 lb (73.9 kg)    Fetal Status: Fetal Heart Rate (bpm): 148   Movement: Present     General:  Alert, oriented and cooperative. Patient is in no acute distress.  Skin: Skin is warm and dry. No rash noted.   Cardiovascular: Normal heart rate noted  Respiratory: Normal respiratory effort, no problems with respiration noted  Abdomen: Soft, gravid, appropriate for gestational age. Pain/Pressure: Present     Pelvic:  Cervical exam deferred        Extremities: Normal range of motion.  Edema: None  Mental Status: Normal mood and affect. Normal behavior. Normal judgment and thought content.   Urinalysis:      Assessment and Plan:  Pregnancy: A5W0981G4P1202 at 4273w2d  1. Supervision of high risk pregnancy, antepartum Stable Tdap today  2. History of stillbirth Start antenatal testing at 32 weeks   3. Current pregnancy with history of pre-term labor in third trimester Continue with weekly 17 OHP  Preterm labor symptoms and general obstetric precautions including but not limited to vaginal bleeding, contractions, leaking of fluid and fetal movement were reviewed in detail with the patient. Please  refer to After Visit Summary for other counseling recommendations.  Return in about 2 years (around 02/18/2019).   Hermina StaggersErvin, Shariya Gaster L, MD

## 2017-02-17 NOTE — Addendum Note (Signed)
Addended by: Mikey BussingWILSON, Edmon Magid L on: 02/17/2017 08:38 AM   Modules accepted: Orders

## 2017-03-02 ENCOUNTER — Ambulatory Visit (INDEPENDENT_AMBULATORY_CARE_PROVIDER_SITE_OTHER): Payer: BLUE CROSS/BLUE SHIELD | Admitting: *Deleted

## 2017-03-02 ENCOUNTER — Ambulatory Visit: Payer: Self-pay

## 2017-03-02 ENCOUNTER — Ambulatory Visit (INDEPENDENT_AMBULATORY_CARE_PROVIDER_SITE_OTHER): Payer: BLUE CROSS/BLUE SHIELD | Admitting: Advanced Practice Midwife

## 2017-03-02 ENCOUNTER — Ambulatory Visit: Payer: BLUE CROSS/BLUE SHIELD

## 2017-03-02 VITALS — BP 106/70 | HR 107 | Wt 165.4 lb

## 2017-03-02 DIAGNOSIS — O09893 Supervision of other high risk pregnancies, third trimester: Secondary | ICD-10-CM

## 2017-03-02 DIAGNOSIS — O09213 Supervision of pregnancy with history of pre-term labor, third trimester: Secondary | ICD-10-CM

## 2017-03-02 DIAGNOSIS — Z8759 Personal history of other complications of pregnancy, childbirth and the puerperium: Secondary | ICD-10-CM | POA: Diagnosis not present

## 2017-03-02 DIAGNOSIS — O0990 Supervision of high risk pregnancy, unspecified, unspecified trimester: Secondary | ICD-10-CM

## 2017-03-02 DIAGNOSIS — O0993 Supervision of high risk pregnancy, unspecified, third trimester: Secondary | ICD-10-CM

## 2017-03-02 NOTE — Progress Notes (Signed)

## 2017-03-02 NOTE — Progress Notes (Signed)
Video interpreter Darl PikesSusan # [phone] used for encounter. Per Dorathy KinsmanVirginia Smith, pt will not need weekly fetal testing as previously ordered. She explained this to pt and she voiced understanding. Last Makena injection received on 01/27/17. I called [facility] and was told that they have not been able to speak with pt in order to get her approval for $50 co-pay, therefore refills have not been sent to office. This was discussed with pt and she stated that they have been calling her husband's phone. She stated that she would like to receive the injections and provided her phone number today. [facility] was contacted and given the updated phone number.

## 2017-03-03 ENCOUNTER — Other Ambulatory Visit: Payer: BLUE CROSS/BLUE SHIELD | Admitting: Obstetrics and Gynecology

## 2017-03-07 ENCOUNTER — Other Ambulatory Visit: Payer: BLUE CROSS/BLUE SHIELD | Admitting: Obstetrics and Gynecology

## 2017-03-07 NOTE — Progress Notes (Signed)
   PRENATAL VISIT NOTE  Subjective:  [name] [name] is a [age] y.o. [id number] at 5556w1d being seen today for ongoing prenatal care.  She is currently monitored for the following issues for this high-risk pregnancy and has Pregnancy with history of pre-term labor; History of stillbirth; Supervision of high risk pregnancy, antepartum; and Rubella non-immune status, antepartum on her problem list.  Patient reports occasional contractions.  Contractions: Irregular. Vag. Bleeding: None.  Movement: Present. Denies leaking of fluid.   The following portions of the patient's history were reviewed and updated as appropriate: allergies, current medications, past family history, past medical history, past social history, past surgical history and problem list. Problem list updated.  Objective:   Vitals:   03/02/17 0859  BP: 106/70  Pulse: (!) 107  Weight: 165 lb 6.4 oz (75 kg)    Fetal Status: Fetal Heart Rate (bpm): NST   Movement: Present     General:  Alert, oriented and cooperative. Patient is in no acute distress.  Skin: Skin is warm and dry. No rash noted.   Cardiovascular: Normal heart rate noted  Respiratory: Normal respiratory effort, no problems with respiration noted  Abdomen: Soft, gravid, appropriate for gestational age.  Pain/Pressure: Present     Pelvic: Cervical exam offered, declined        Extremities: Normal range of motion.  Edema: None  Mental Status:  Normal mood and affect. Normal behavior. Normal judgment and thought content.   Assessment and Plan:  Pregnancy: A5W0981G4P1202 at 2847w6d  1. Supervision of high risk pregnancy, antepartum  - US MFM OB FOLLOW UP; Future  2. History of stillbirth - Antenatal testing not indicated 2/2 Hx IUFD < 28 weeks. Explained to pt via video interpreter - US MFM OB FOLLOW UP; Future  3. History of preterm delivery, currently pregnant, third trimester - Restart 17-P (Explained that she needs to pay co-Pay) - US MFM OB FOLLOW UP; Future  Preterm  labor symptoms and general obstetric precautions including but not limited to vaginal bleeding, contractions, leaking of fluid and fetal movement were reviewed in detail with the patient. Please refer to After Visit Summary for other counseling recommendations.  Return in about 7 days (around 03/09/2017) for 17P injection before US; [facility].   Dorathy KinsmanVirginia Jakylah Bassinger, CNM

## 2017-03-07 NOTE — Patient Instructions (Signed)

## 2017-03-07 NOTE — Progress Notes (Signed)
   PRENATAL VISIT NOTE  Subjective:  [name] [name] is a 31 y.o. [id number] at 7167w6d being seen today for ongoing prenatal care.  She is currently monitored for the following issues for this high-risk pregnancy and has Pregnancy with history of pre-term labor; History of stillbirth; Supervision of high risk pregnancy, antepartum; and Rubella non-immune status, antepartum on her problem list.  Patient reports no complaints.  Contractions: Irregular. Vag. Bleeding: None.  Movement: Present. Denies leaking of fluid.   The following portions of the patient's history were reviewed and updated as appropriate: allergies, current medications, past family history, past medical history, past social history, past surgical history and problem list. Problem list updated.  Objective:   Vitals:   03/02/17 0859  BP: 106/70  Pulse: (!) 107  Weight: 165 lb 6.4 oz (75 kg)    Fetal Status: Fetal Heart Rate (bpm): NST   Movement: Present     General:  Alert, oriented and cooperative. Patient is in no acute distress.  Skin: Skin is warm and dry. No rash noted.   Cardiovascular: Normal heart rate noted  Respiratory: Normal respiratory effort, no problems with respiration noted  Abdomen: Soft, gravid, appropriate for gestational age.  Pain/Pressure: Present     Pelvic: Cervical exam deferred        Extremities: Normal range of motion.  Edema: None  Mental Status:  Normal mood and affect. Normal behavior. Normal judgment and thought content.   Assessment and Plan:  Pregnancy: [id number] at 6467w6d  1. Supervision of high risk pregnancy, antepartum  - [place] MFM OB FOLLOW UP; Future  2. History of stillbirth  - [place] MFM OB FOLLOW UP; Future  3. History of preterm delivery, currently pregnant, third trimester  - [facility] MFM OB FOLLOW UP; Future  Preterm labor symptoms and general obstetric precautions including but not limited to vaginal bleeding, contractions, leaking of fluid and fetal movement were reviewed in detail  with the patient. Please refer to After Visit Summary for other counseling recommendations.  Return in about 7 days (around 03/09/2017) for 17P injection before US; [facility]   Dorathy KinsmanVirginia Melisha Eggleton, CNM

## 2017-03-09 ENCOUNTER — Encounter: Payer: BLUE CROSS/BLUE SHIELD | Admitting: Obstetrics and Gynecology

## 2017-03-09 ENCOUNTER — Ambulatory Visit: Payer: BLUE CROSS/BLUE SHIELD

## 2017-03-09 ENCOUNTER — Ambulatory Visit (HOSPITAL_COMMUNITY)
Admission: RE | Admit: 2017-03-09 | Discharge: 2017-03-09 | Disposition: A | Discharge status: Home / Self Care | Payer: BLUE CROSS/BLUE SHIELD | Source: Ambulatory Visit | Attending: Advanced Practice Midwife | Admitting: Advanced Practice Midwife

## 2017-03-09 ENCOUNTER — Other Ambulatory Visit: Payer: BLUE CROSS/BLUE SHIELD

## 2017-03-09 ENCOUNTER — Encounter (HOSPITAL_COMMUNITY): Payer: Self-pay

## 2017-03-09 ENCOUNTER — Other Ambulatory Visit (HOSPITAL_COMMUNITY): Payer: Self-pay | Admitting: *Deleted

## 2017-03-09 DIAGNOSIS — Z8759 Personal history of other complications of pregnancy, childbirth and the puerperium: Secondary | ICD-10-CM

## 2017-03-09 DIAGNOSIS — O0993 Supervision of high risk pregnancy, unspecified, third trimester: Secondary | ICD-10-CM | POA: Insufficient documentation

## 2017-03-09 DIAGNOSIS — O0990 Supervision of high risk pregnancy, unspecified, unspecified trimester: Secondary | ICD-10-CM | POA: Diagnosis not present

## 2017-03-09 DIAGNOSIS — Z362 Encounter for other antenatal screening follow-up: Secondary | ICD-10-CM | POA: Diagnosis not present

## 2017-03-09 DIAGNOSIS — Z3A33 33 weeks gestation of pregnancy: Secondary | ICD-10-CM | POA: Diagnosis not present

## 2017-03-09 DIAGNOSIS — O09213 Supervision of pregnancy with history of pre-term labor, third trimester: Secondary | ICD-10-CM | POA: Diagnosis not present

## 2017-03-09 DIAGNOSIS — O09893 Supervision of other high risk pregnancies, third trimester: Secondary | ICD-10-CM

## 2017-03-09 DIAGNOSIS — O09299 Supervision of pregnancy with other poor reproductive or obstetric history, unspecified trimester: Secondary | ICD-10-CM | POA: Diagnosis not present

## 2017-03-09 DIAGNOSIS — Z3A32 32 weeks gestation of pregnancy: Secondary | ICD-10-CM | POA: Diagnosis not present

## 2017-03-17 ENCOUNTER — Ambulatory Visit (INDEPENDENT_AMBULATORY_CARE_PROVIDER_SITE_OTHER): Payer: BLUE CROSS/BLUE SHIELD | Admitting: Obstetrics & Gynecology

## 2017-03-17 VITALS — BP 91/68 | HR 94 | Wt 167.0 lb

## 2017-03-17 DIAGNOSIS — O09213 Supervision of pregnancy with history of pre-term labor, third trimester: Secondary | ICD-10-CM

## 2017-03-17 DIAGNOSIS — Z8759 Personal history of other complications of pregnancy, childbirth and the puerperium: Secondary | ICD-10-CM

## 2017-03-17 DIAGNOSIS — O0993 Supervision of high risk pregnancy, unspecified, third trimester: Secondary | ICD-10-CM

## 2017-03-17 DIAGNOSIS — O0990 Supervision of high risk pregnancy, unspecified, unspecified trimester: Secondary | ICD-10-CM

## 2017-03-17 NOTE — Progress Notes (Signed)
   PRENATAL VISIT NOTE  Subjective:  [name] [name] is a 31 y.o. [id number] at 7471w2d being seen today for ongoing prenatal care.  Due to language barrier, a Burmese interpreter was present during the history-taking and subsequent discussion (and for part of the physical exam) with this patient. She is currently monitored for the following issues for this high-risk pregnancy and has Pregnancy with history of pre-term labor; History of stillbirth at 24 weeks; Supervision of high risk pregnancy, antepartum; and Rubella non-immune status, antepartum on her problem list.  Patient reports no complaints.  Contractions: Irritability. Vag. Bleeding: None.  Movement: Present. Denies leaking of fluid.   The following portions of the patient's history were reviewed and updated as appropriate: allergies, current medications, past family history, past medical history, past social history, past surgical history and problem list. Problem list updated.  Objective:   Vitals:   03/17/17 1454  BP: 91/68  Pulse: 94  Weight: 167 lb (75.8 kg)    Fetal Status: Fetal Heart Rate (bpm): 124   Movement: Present     General:  Alert, oriented and cooperative. Patient is in no acute distress.  Skin: Skin is warm and dry. No rash noted.   Cardiovascular: Normal heart rate noted  Respiratory: Normal respiratory effort, no problems with respiration noted  Abdomen: Soft, gravid, appropriate for gestational age.  Pain/Pressure: Present     Pelvic: Cervical exam deferred        Extremities: Normal range of motion.  Edema: None  Mental Status:  Normal mood and affect. Normal behavior. Normal judgment and thought content.   Assessment and Plan:  Pregnancy: [id number] at 4471w2d  1. History of stillbirth at 24 weeks No need for antenatal testing.  Serial growth scans ordered by MFM.  2. Current pregnancy with history of pre-term labor in third trimester Not on 17P, will continue to monitor  3. Supervision of high risk pregnancy,  antepartum Preterm labor symptoms and general obstetric precautions including but not limited to vaginal bleeding, contractions, leaking of fluid and fetal movement were reviewed in detail with the patient. Please refer to After Visit Summary for other counseling recommendations.  Return in about 2 weeks (around 03/31/2017) for OB Visit [facility](HOB), Pelvic cultures.   Jaynie CollinsUgonna Mecca Guitron, MD

## 2017-03-17 NOTE — Patient Instructions (Signed)
Return to clinic for any scheduled appointments or obstetric concerns, or go to [facility] for evaluation  

## 2017-04-06 ENCOUNTER — Ambulatory Visit (HOSPITAL_COMMUNITY)
Admission: RE | Admit: 2017-04-06 | Discharge: 2017-04-06 | Disposition: A | Discharge status: Home / Self Care | Payer: BLUE CROSS/BLUE SHIELD | Source: Ambulatory Visit | Attending: Obstetrics & Gynecology | Admitting: Obstetrics & Gynecology

## 2017-04-06 ENCOUNTER — Ambulatory Visit (INDEPENDENT_AMBULATORY_CARE_PROVIDER_SITE_OTHER): Payer: BLUE CROSS/BLUE SHIELD | Admitting: Family Medicine

## 2017-04-06 ENCOUNTER — Encounter (HOSPITAL_COMMUNITY): Payer: Self-pay

## 2017-04-06 ENCOUNTER — Other Ambulatory Visit (HOSPITAL_COMMUNITY): Payer: Self-pay | Admitting: Maternal and Fetal Medicine

## 2017-04-06 VITALS — BP 101/68 | HR 87 | Wt 172.3 lb

## 2017-04-06 DIAGNOSIS — Z3A36 36 weeks gestation of pregnancy: Secondary | ICD-10-CM

## 2017-04-06 DIAGNOSIS — O09293 Supervision of pregnancy with other poor reproductive or obstetric history, third trimester: Secondary | ICD-10-CM | POA: Insufficient documentation

## 2017-04-06 DIAGNOSIS — O358XX1 Maternal care for other (suspected) fetal abnormality and damage, fetus 1: Secondary | ICD-10-CM

## 2017-04-06 DIAGNOSIS — Z8759 Personal history of other complications of pregnancy, childbirth and the puerperium: Secondary | ICD-10-CM

## 2017-04-06 DIAGNOSIS — O403XX1 Polyhydramnios, third trimester, fetus 1: Secondary | ICD-10-CM | POA: Diagnosis not present

## 2017-04-06 DIAGNOSIS — O403XX0 Polyhydramnios, third trimester, not applicable or unspecified: Secondary | ICD-10-CM

## 2017-04-06 DIAGNOSIS — O358XX0 Maternal care for other (suspected) fetal abnormality and damage, not applicable or unspecified: Secondary | ICD-10-CM | POA: Diagnosis not present

## 2017-04-06 DIAGNOSIS — O09213 Supervision of pregnancy with history of pre-term labor, third trimester: Secondary | ICD-10-CM | POA: Diagnosis not present

## 2017-04-06 DIAGNOSIS — Z113 Encounter for screening for infections with a predominantly sexual mode of transmission: Secondary | ICD-10-CM | POA: Diagnosis not present

## 2017-04-06 DIAGNOSIS — O0990 Supervision of high risk pregnancy, unspecified, unspecified trimester: Secondary | ICD-10-CM

## 2017-04-06 LAB — OB RESULTS CONSOLE GBS: STREP GROUP B AG: NEGATIVE

## 2017-04-06 NOTE — Patient Instructions (Signed)
Breastfeeding Deciding to breastfeed is one of the best choices you can make for you and your baby. A change in hormones during pregnancy causes your breast tissue to grow and increases the number and size of your milk ducts. These hormones also allow proteins, sugars, and fats from your blood supply to make breast milk in your milk-producing glands. Hormones prevent breast milk from being released before your baby is born as well as prompt milk flow after birth. Once breastfeeding has begun, thoughts of your baby, as well as his or her sucking or crying, can stimulate the release of milk from your milk-producing glands. Benefits of breastfeeding For Your Baby  Your first milk (colostrum) helps your baby's digestive system function better.  There are antibodies in your milk that help your baby fight off infections.  Your baby has a lower incidence of asthma, allergies, and sudden infant death syndrome.  The nutrients in breast milk are better for your baby than infant formulas and are designed uniquely for your baby's needs.  Breast milk improves your baby's brain development.  Your baby is less likely to develop other conditions, such as childhood obesity, asthma, or type 2 diabetes mellitus.  For You  Breastfeeding helps to create a very special bond between you and your baby.  Breastfeeding is convenient. Breast milk is always available at the correct temperature and costs nothing.  Breastfeeding helps to burn calories and helps you lose the weight gained during pregnancy.  Breastfeeding makes your uterus contract to its prepregnancy size faster and slows bleeding (lochia) after you give birth.  Breastfeeding helps to lower your risk of developing type 2 diabetes mellitus, osteoporosis, and breast or ovarian cancer later in life.  Signs that your baby is hungry Early Signs of Hunger  Increased alertness or activity.  Stretching.  Movement of the head from side to  side.  Movement of the head and opening of the mouth when the corner of the mouth or cheek is stroked (rooting).  Increased sucking sounds, smacking lips, cooing, sighing, or squeaking.  Hand-to-mouth movements.  Increased sucking of fingers or hands.  Late Signs of Hunger  Fussing.  Intermittent crying.  Extreme Signs of Hunger Signs of extreme hunger will require calming and consoling before your baby will be able to breastfeed successfully. Do not wait for the following signs of extreme hunger to occur before you initiate breastfeeding:  Restlessness.  A loud, strong cry.  Screaming.  Breastfeeding basics Breastfeeding Initiation  Find a comfortable place to sit or lie down, with your neck and back well supported.  Place a pillow or rolled up blanket under your baby to bring him or her to the level of your breast (if you are seated). Nursing pillows are specially designed to help support your arms and your baby while you breastfeed.  Make sure that your baby's abdomen is facing your abdomen.  Gently massage your breast. With your fingertips, massage from your chest wall toward your nipple in a circular motion. This encourages milk flow. You may need to continue this action during the feeding if your milk flows slowly.  Support your breast with 4 fingers underneath and your thumb above your nipple. Make sure your fingers are well away from your nipple and your baby's mouth.  Stroke your baby's lips gently with your finger or nipple.  When your baby's mouth is open wide enough, quickly bring your baby to your breast, placing your entire nipple and as much of the colored area   around your nipple (areola) as possible into your baby's mouth. ? More areola should be visible above your baby's upper lip than below the lower lip. ? Your baby's tongue should be between his or her lower gum and your breast.  Ensure that your baby's mouth is correctly positioned around your nipple  (latched). Your baby's lips should create a seal on your breast and be turned out (everted).  It is common for your baby to suck about 2-3 minutes in order to start the flow of breast milk.  Latching Teaching your baby how to latch on to your breast properly is very important. An improper latch can cause nipple pain and decreased milk supply for you and poor weight gain in your baby. Also, if your baby is not latched onto your nipple properly, he or she may swallow some air during feeding. This can make your baby fussy. Burping your baby when you switch breasts during the feeding can help to get rid of the air. However, teaching your baby to latch on properly is still the best way to prevent fussiness from swallowing air while breastfeeding. Signs that your baby has successfully latched on to your nipple:  Silent tugging or silent sucking, without causing you pain.  Swallowing heard between every 3-4 sucks.  Muscle movement above and in front of his or her ears while sucking.  Signs that your baby has not successfully latched on to nipple:  Sucking sounds or smacking sounds from your baby while breastfeeding.  Nipple pain.  If you think your baby has not latched on correctly, slip your finger into the corner of your baby's mouth to break the suction and place it between your baby's gums. Attempt breastfeeding initiation again. Signs of Successful Breastfeeding Signs from your baby:  A gradual decrease in the number of sucks or complete cessation of sucking.  Falling asleep.  Relaxation of his or her body.  Retention of a small amount of milk in his or her mouth.  Letting go of your breast by himself or herself.  Signs from you:  Breasts that have increased in firmness, weight, and size 1-3 hours after feeding.  Breasts that are softer immediately after breastfeeding.  Increased milk volume, as well as a change in milk consistency and color by the fifth day of  breastfeeding.  Nipples that are not sore, cracked, or bleeding.  Signs That Your Baby is Getting Enough Milk  Wetting at least 1-2 diapers during the first 24 hours after birth.  Wetting at least 5-6 diapers every 24 hours for the first week after birth. The urine should be clear or pale yellow by 5 days after birth.  Wetting 6-8 diapers every 24 hours as your baby continues to grow and develop.  At least 3 stools in a 24-hour period by age 5 days. The stool should be soft and yellow.  At least 3 stools in a 24-hour period by age 7 days. The stool should be seedy and yellow.  No loss of weight greater than 10% of birth weight during the first 3 days of age.  Average weight gain of 4-7 ounces (113-198 g) per week after age 4 days.  Consistent daily weight gain by age 5 days, without weight loss after the age of 2 weeks.  After a feeding, your baby may spit up a small amount. This is common. Breastfeeding frequency and duration Frequent feeding will help you make more milk and can prevent sore nipples and breast engorgement. Breastfeed when   you feel the need to reduce the fullness of your breasts or when your baby shows signs of hunger. This is called "breastfeeding on demand." Avoid introducing a pacifier to your baby while you are working to establish breastfeeding (the first 4-6 weeks after your baby is born). After this time you may choose to use a pacifier. Research has shown that pacifier use during the first year of a baby's life decreases the risk of sudden infant death syndrome (SIDS). Allow your baby to feed on each breast as long as he or she wants. Breastfeed until your baby is finished feeding. When your baby unlatches or falls asleep while feeding from the first breast, offer the second breast. Because newborns are often sleepy in the first few weeks of life, you may need to awaken your baby to get him or her to feed. Breastfeeding times will vary from baby to baby. However,  the following rules can serve as a guide to help you ensure that your baby is properly fed:  Newborns (babies 4 weeks of age or younger) may breastfeed every 1-3 hours.  Newborns should not go longer than 3 hours during the day or 5 hours during the night without breastfeeding.  You should breastfeed your baby a minimum of 8 times in a 24-hour period until you begin to introduce solid foods to your baby at around 6 months of age.  Breast milk pumping Pumping and storing breast milk allows you to ensure that your baby is exclusively fed your breast milk, even at times when you are unable to breastfeed. This is especially important if you are going back to work while you are still breastfeeding or when you are not able to be present during feedings. Your lactation consultant can give you guidelines on how long it is safe to store breast milk. A breast pump is a machine that allows you to pump milk from your breast into a sterile bottle. The pumped breast milk can then be stored in a refrigerator or freezer. Some breast pumps are operated by hand, while others use electricity. Ask your lactation consultant which type will work best for you. Breast pumps can be purchased, but some hospitals and breastfeeding support groups lease breast pumps on a monthly basis. A lactation consultant can teach you how to hand express breast milk, if you prefer not to use a pump. Caring for your breasts while you breastfeed Nipples can become dry, cracked, and sore while breastfeeding. The following recommendations can help keep your breasts moisturized and healthy:  Avoid using soap on your nipples.  Wear a supportive bra. Although not required, special nursing bras and tank tops are designed to allow access to your breasts for breastfeeding without taking off your entire bra or top. Avoid wearing underwire-style bras or extremely tight bras.  Air dry your nipples for 3-4minutes after each feeding.  Use only cotton  bra pads to absorb leaked breast milk. Leaking of breast milk between feedings is normal.  Use lanolin on your nipples after breastfeeding. Lanolin helps to maintain your skin's normal moisture barrier. If you use pure lanolin, you do not need to wash it off before feeding your baby again. Pure lanolin is not toxic to your baby. You may also hand express a few drops of breast milk and gently massage that milk into your nipples and allow the milk to air dry.  In the first few weeks after giving birth, some women experience extremely full breasts (engorgement). Engorgement can make your   breasts feel heavy, warm, and tender to the touch. Engorgement peaks within 3-5 days after you give birth. The following recommendations can help ease engorgement:  Completely empty your breasts while breastfeeding or pumping. You may want to start by applying warm, moist heat (in the shower or with warm water-soaked hand towels) just before feeding or pumping. This increases circulation and helps the milk flow. If your baby does not completely empty your breasts while breastfeeding, pump any extra milk after he or she is finished.  Wear a snug bra (nursing or regular) or tank top for 1-2 days to signal your body to slightly decrease milk production.  Apply ice packs to your breasts, unless this is too uncomfortable for you.  Make sure that your baby is latched on and positioned properly while breastfeeding.  If engorgement persists after 48 hours of following these recommendations, contact your health care provider or a lactation consultant. Overall health care recommendations while breastfeeding  Eat healthy foods. Alternate between meals and snacks, eating 3 of each per day. Because what you eat affects your breast milk, some of the foods may make your baby more irritable than usual. Avoid eating these foods if you are sure that they are negatively affecting your baby.  Drink milk, fruit juice, and water to  satisfy your thirst (about 10 glasses a day).  Rest often, relax, and continue to take your prenatal vitamins to prevent fatigue, stress, and anemia.  Continue breast self-awareness checks.  Avoid chewing and smoking tobacco. Chemicals from cigarettes that pass into breast milk and exposure to secondhand smoke may harm your baby.  Avoid alcohol and drug use, including marijuana. Some medicines that may be harmful to your baby can pass through breast milk. It is important to ask your health care provider before taking any medicine, including all over-the-counter and prescription medicine as well as vitamin and herbal supplements. It is possible to become pregnant while breastfeeding. If birth control is desired, ask your health care provider about options that will be safe for your baby. Contact a health care provider if:  You feel like you want to stop breastfeeding or have become frustrated with breastfeeding.  You have painful breasts or nipples.  Your nipples are cracked or bleeding.  Your breasts are red, tender, or warm.  You have a swollen area on either breast.  You have a fever or chills.  You have nausea or vomiting.  You have drainage other than breast milk from your nipples.  Your breasts do not become full before feedings by the fifth day after you give birth.  You feel sad and depressed.  Your baby is too sleepy to eat well.  Your baby is having trouble sleeping.  Your baby is wetting less than 3 diapers in a 24-hour period.  Your baby has less than 3 stools in a 24-hour period.  Your baby's skin or the white part of his or her eyes becomes yellow.  Your baby is not gaining weight by 5 days of age. Get help right away if:  Your baby is overly tired (lethargic) and does not want to wake up and feed.  Your baby develops an unexplained fever. This information is not intended to replace advice given to you by your health care provider. Make sure you discuss  any questions you have with your health care provider. Document Released: 07/26/2005 Document Revised: 01/07/2016 Document Reviewed: 01/17/2013 Elsevier Interactive Patient Education  2017 [employer] Inc[employer]  

## 2017-04-06 NOTE — Progress Notes (Signed)
   PRENATAL VISIT NOTE  Subjective:  [name] [name] is a 31 y.o. [id number] at 7041w1d being seen today for ongoing prenatal care.  She is currently monitored for the following issues for this high-risk pregnancy and has Pregnancy with history of pre-term labor; History of stillbirth at 24 weeks; Supervision of high risk pregnancy, antepartum; Polyhydramnios in third trimester, antepartum complication; and Rubella non-immune status, antepartum on her problem list.  Patient reports no complaints.  Contractions: Irregular. Vag. Bleeding: None.  Movement: Present. Denies leaking of fluid.   The following portions of the patient's history were reviewed and updated as appropriate: allergies, current medications, past family history, past medical history, past social history, past surgical history and problem list. Problem list updated.  Objective:   Vitals:   04/06/17 1351  BP: 101/68  Pulse: 87  Weight: 172 lb 4.8 oz (78.2 kg)    Fetal Status: Fetal Heart Rate (bpm): 147 Fundal Height: 35 cm Movement: Present  Presentation: Vertex  General:  Alert, oriented and cooperative. Patient is in no acute distress.  Skin: Skin is warm and dry. No rash noted.   Cardiovascular: Normal heart rate noted  Respiratory: Normal respiratory effort, no problems with respiration noted  Abdomen: Soft, gravid, appropriate for gestational age.  Pain/Pressure: Present     Pelvic: Cervical exam performed Dilation: 1.5 Effacement (%): 20 Station: -3  Extremities: Normal range of motion.  Edema: Trace  Mental Status:  Normal mood and affect. Normal behavior. Normal judgment and thought content.   Assessment and Plan:  Pregnancy: [id number] at 7341w1d  1. Supervision of high risk pregnancy, antepartum Continue prenatal care. Cultures today - Culture, beta strep (group b only) - GC[facility]Chlamydia probe amp ([place]  2. Current pregnancy with history of pre-term labor in third trimester At term now  3.  Polyhydramnios in third trimester, antepartum complication New diagnosis--weekly BPP Schedule IOL at 39 wks.  Term labor symptoms and general obstetric precautions including but not limited to vaginal bleeding, contractions, leaking of fluid and fetal movement were reviewed in detail with the patient. Please refer to After Visit Summary for other counseling recommendations.  Return in 1 week (on 04/13/2017) [facility] and [facility].   [name] Boresanya S Akua Blethen, MD

## 2017-04-06 NOTE — Progress Notes (Signed)
[employer] interpreter Delorise Shiner [phone]

## 2017-04-07 ENCOUNTER — Other Ambulatory Visit (HOSPITAL_COMMUNITY): Payer: Self-pay | Admitting: *Deleted

## 2017-04-07 ENCOUNTER — Telehealth (HOSPITAL_COMMUNITY): Payer: Self-pay | Admitting: *Deleted

## 2017-04-07 ENCOUNTER — Encounter (HOSPITAL_COMMUNITY): Payer: Self-pay | Admitting: *Deleted

## 2017-04-07 DIAGNOSIS — O358XX0 Maternal care for other (suspected) fetal abnormality and damage, not applicable or unspecified: Secondary | ICD-10-CM

## 2017-04-07 LAB — GC/CHLAMYDIA PROBE AMP (~~LOC~~) NOT AT ARMC
Chlamydia: NEGATIVE
NEISSERIA GONORRHEA: NEGATIVE

## 2017-04-07 NOTE — Telephone Encounter (Signed)
[phone] interpreter number

## 2017-04-08 ENCOUNTER — Encounter: Payer: Self-pay | Admitting: *Deleted

## 2017-04-10 LAB — CULTURE, BETA STREP (GROUP B ONLY): STREP GP B CULTURE: NEGATIVE

## 2017-04-12 ENCOUNTER — Ambulatory Visit (INDEPENDENT_AMBULATORY_CARE_PROVIDER_SITE_OTHER): Payer: BLUE CROSS/BLUE SHIELD | Admitting: *Deleted

## 2017-04-12 ENCOUNTER — Encounter (HOSPITAL_COMMUNITY): Payer: Self-pay

## 2017-04-12 ENCOUNTER — Ambulatory Visit (HOSPITAL_COMMUNITY)
Admission: RE | Admit: 2017-04-12 | Discharge: 2017-04-12 | Disposition: A | Discharge status: Home / Self Care | Payer: BLUE CROSS/BLUE SHIELD | Source: Ambulatory Visit | Attending: Obstetrics & Gynecology | Admitting: Obstetrics & Gynecology

## 2017-04-12 ENCOUNTER — Ambulatory Visit (INDEPENDENT_AMBULATORY_CARE_PROVIDER_SITE_OTHER): Payer: BLUE CROSS/BLUE SHIELD | Admitting: Advanced Practice Midwife

## 2017-04-12 VITALS — BP 104/64 | HR 87 | Wt 170.6 lb

## 2017-04-12 DIAGNOSIS — O09213 Supervision of pregnancy with history of pre-term labor, third trimester: Secondary | ICD-10-CM | POA: Insufficient documentation

## 2017-04-12 DIAGNOSIS — Z23 Encounter for immunization: Secondary | ICD-10-CM | POA: Diagnosis not present

## 2017-04-12 DIAGNOSIS — O358XX0 Maternal care for other (suspected) fetal abnormality and damage, not applicable or unspecified: Secondary | ICD-10-CM

## 2017-04-12 DIAGNOSIS — O403XX0 Polyhydramnios, third trimester, not applicable or unspecified: Secondary | ICD-10-CM | POA: Diagnosis not present

## 2017-04-12 DIAGNOSIS — Z3A36 36 weeks gestation of pregnancy: Secondary | ICD-10-CM

## 2017-04-12 DIAGNOSIS — O0990 Supervision of high risk pregnancy, unspecified, unspecified trimester: Secondary | ICD-10-CM

## 2017-04-12 DIAGNOSIS — O09293 Supervision of pregnancy with other poor reproductive or obstetric history, third trimester: Secondary | ICD-10-CM | POA: Insufficient documentation

## 2017-04-12 DIAGNOSIS — O09893 Supervision of other high risk pregnancies, third trimester: Secondary | ICD-10-CM

## 2017-04-12 DIAGNOSIS — Z3A37 37 weeks gestation of pregnancy: Secondary | ICD-10-CM | POA: Diagnosis not present

## 2017-04-12 NOTE — Patient Instructions (Signed)
Labor Induction Labor induction is when steps are taken to cause a pregnant woman to begin the labor process. Most women go into labor on their own between 37 weeks and 42 weeks of the pregnancy. When this does not happen or when there is a medical need, methods may be used to induce labor. Labor induction causes a pregnant woman's uterus to contract. It also causes the cervix to soften (ripen), open (dilate), and thin out (efface). Usually, labor is not induced before 39 weeks of the pregnancy unless there is a problem with the baby or mother. Before inducing labor, your health care provider will consider a number of factors, including the following:  The medical condition of you and the baby.  How many weeks along you are.  The status of the baby's lung maturity.  The condition of the cervix.  The position of the baby. What are the reasons for labor induction? Labor may be induced for the following reasons:  The health of the baby or mother is at risk.  The pregnancy is overdue by 1 week or more.  The water breaks but labor does not start on its own.  The mother has a health condition or serious illness, such as high blood pressure, infection, placental abruption, or diabetes.  The amniotic fluid amounts are low around the baby.  The baby is distressed. Convenience or wanting the baby to be born on a certain date is not a reason for inducing labor. What methods are used for labor induction? Several methods of labor induction may be used, such as:  Prostaglandin medicine. This medicine causes the cervix to dilate and ripen. The medicine will also start contractions. It can be taken by mouth or by inserting a suppository into the vagina.  Inserting a thin tube (catheter) with a balloon on the end into the vagina to dilate the cervix. Once inserted, the balloon is expanded with water, which causes the cervix to open.  Stripping the membranes. Your health care provider separates  amniotic sac tissue from the cervix, causing the cervix to be stretched and causing the release of a hormone called progesterone. This may cause the uterus to contract. It is often done during an office visit. You will be sent home to wait for the contractions to begin. You will then come in for an induction.  Breaking the water. Your health care provider makes a hole in the amniotic sac using a small instrument. Once the amniotic sac breaks, contractions should begin. This may still take hours to see an effect.  Medicine to trigger or strengthen contractions. This medicine is given through an IV access tube inserted into a vein in your arm. All of the methods of induction, besides stripping the membranes, will be done in the hospital. Induction is done in the hospital so that you and the baby can be carefully monitored. How long does it take for labor to be induced? Some inductions can take up to 2-3 days. Depending on the cervix, it usually takes less time. It takes longer when you are induced early in the pregnancy or if this is your first pregnancy. If a mother is still pregnant and the induction has been going on for 2-3 days, either the mother will be sent home or a cesarean delivery will be needed. What are the risks associated with labor induction? Some of the risks of induction include:  Changes in fetal heart rate, such as too high, too low, or erratic.  Fetal distress.    Chance of infection for the mother and baby.  Increased chance of having a cesarean delivery.  Breaking off (abruption) of the placenta from the uterus (rare).  Uterine rupture (very rare). When induction is needed for medical reasons, the benefits of induction may outweigh the risks. What are some reasons for not inducing labor? Labor induction should not be done if:  It is shown that your baby does not tolerate labor.  You have had previous surgeries on your uterus, such as a myomectomy or the removal of  fibroids.  Your placenta lies very low in the uterus and blocks the opening of the cervix (placenta previa).  Your baby is not in a head-down position.  The umbilical cord drops down into the birth canal in front of the baby. This could cut off the baby's blood and oxygen supply.  You have had a previous cesarean delivery.  There are unusual circumstances, such as the baby being extremely premature. This information is not intended to replace advice given to you by your health care provider. Make sure you discuss any questions you have with your health care provider. Document Released: 12/15/2006 Document Revised: 01/01/2016 Document Reviewed: 02/22/2013 Elsevier Interactive Patient Education  2017 Elsevier Inc.  

## 2017-04-12 NOTE — Progress Notes (Signed)
   PRENATAL VISIT NOTE  Subjective:  Elianie Reber is a 31 y.o. [id number] at 1817w6d being seen today for ongoing prenatal care.  She is currently monitored for the following issues for this high-risk pregnancy and has Pregnancy with history of pre-term labor; History of stillbirth at 24 weeks; Supervision of high risk pregnancy, antepartum; Polyhydramnios in third trimester, antepartum complication; and Rubella non-immune status, antepartum on her problem list.  Patient reports no complaints.  Contractions: Irregular. Vag. Bleeding: None.  Movement: Present. Denies leaking of fluid.   The following portions of the patient's history were reviewed and updated as appropriate: allergies, current medications, past family history, past medical history, past social history, past surgical history and problem list. Problem list updated.  Objective:   Vitals:   04/12/17 1117  BP: 104/64  Pulse: 87  Weight: 170 lb 9.6 oz (77.4 kg)    Fetal Status: Fetal Heart Rate (bpm): NST   Movement: Present     General:  Alert, oriented and cooperative. Patient is in no acute distress.  Skin: Skin is warm and dry. No rash noted.   Cardiovascular: Normal heart rate noted  Respiratory: Normal respiratory effort, no problems with respiration noted  Abdomen: Soft, gravid, appropriate for gestational age.  Pain/Pressure: Present     Pelvic: Cervical exam deferred        Extremities: Normal range of motion.  Edema: Trace  Mental Status:  Normal mood and affect. Normal behavior. Normal judgment and thought content.   Assessment and Plan:  Pregnancy: [id number] at 6717w6d  1. Polyhydramnios in third trimester, antepartum complication - BPP 8/8  2. Supervision of high risk pregnancy, antepartum   3. Umbilical Vein Verix - Dr. Sherrie Georgeecker called to recommend IOL at 37 weeks. Scheduled 9/5 at 0730.  Term labor symptoms and general obstetric precautions including but not limited to vaginal bleeding, contractions, leaking of  fluid and fetal movement were reviewed in detail with the patient. Please refer to After Visit Summary for other counseling recommendations.  Return in about 4 weeks (around 05/10/2017) for PP visit.  IOL on 9/11, Postpartum visit.   Dorathy KinsmanVirginia Roxsana Riding, CNM

## 2017-04-12 NOTE — Progress Notes (Signed)
BPP done @ [facility]FM today - pt states there was a "problem" with her US today but she is unable to explain.  IOL scheduled on 9/11.

## 2017-04-13 ENCOUNTER — Encounter: Payer: Self-pay | Admitting: Obstetrics and Gynecology

## 2017-04-13 ENCOUNTER — Inpatient Hospital Stay (HOSPITAL_COMMUNITY)
Admission: RE | Admit: 2017-04-13 | Discharge: 2017-04-15 | DRG: 775 | Disposition: A | Discharge status: Home / Self Care | Payer: BLUE CROSS/BLUE SHIELD | Source: Ambulatory Visit | Attending: Family Medicine | Admitting: Family Medicine

## 2017-04-13 DIAGNOSIS — Z3A37 37 weeks gestation of pregnancy: Secondary | ICD-10-CM

## 2017-04-13 DIAGNOSIS — Z283 Underimmunization status: Secondary | ICD-10-CM

## 2017-04-13 DIAGNOSIS — O358XX0 Maternal care for other (suspected) fetal abnormality and damage, not applicable or unspecified: Secondary | ICD-10-CM | POA: Diagnosis present

## 2017-04-13 DIAGNOSIS — O9989 Other specified diseases and conditions complicating pregnancy, childbirth and the puerperium: Secondary | ICD-10-CM

## 2017-04-13 DIAGNOSIS — O09899 Supervision of other high risk pregnancies, unspecified trimester: Secondary | ICD-10-CM

## 2017-04-13 DIAGNOSIS — O403XX0 Polyhydramnios, third trimester, not applicable or unspecified: Secondary | ICD-10-CM

## 2017-04-13 DIAGNOSIS — O409XX0 Polyhydramnios, unspecified trimester, not applicable or unspecified: Secondary | ICD-10-CM | POA: Diagnosis present

## 2017-04-13 HISTORY — DX: Personal history of other complications of pregnancy, childbirth and the puerperium: Z87.59

## 2017-04-13 LAB — CBC
HCT: 41.2 % (ref 36.0–46.0)
Hemoglobin: 14 g/dL (ref 12.0–15.0)
MCH: 30.4 pg (ref 26.0–34.0)
MCHC: 34 g/dL (ref 30.0–36.0)
MCV: 89.6 fL (ref 78.0–100.0)
PLATELETS: 164 10*3/uL (ref 150–400)
RBC: 4.6 MIL/uL (ref 3.87–5.11)
RDW: 14.9 % (ref 11.5–15.5)
WBC: 7.8 10*3/uL (ref 4.0–10.5)

## 2017-04-13 LAB — TYPE AND SCREEN
ABO/RH(D): O POS
Antibody Screen: NEGATIVE

## 2017-04-13 LAB — RPR: RPR Ser Ql: NONREACTIVE

## 2017-04-13 MED ORDER — LACTATED RINGERS IV SOLN
INTRAVENOUS | Status: DC
  Administered 2017-04-13: 08:00:00 via INTRAVENOUS

## 2017-04-13 MED ORDER — PRENATAL MULTIVITAMIN CH
1.0000 | ORAL_TABLET | Freq: Every day | ORAL | Status: DC
  Administered 2017-04-14: 1 via ORAL
  Filled 2017-04-13: qty 1

## 2017-04-13 MED ORDER — OXYTOCIN 40 UNITS IN LACTATED RINGERS INFUSION - SIMPLE MED
2.5000 [IU]/h | INTRAVENOUS | Status: DC
  Filled 2017-04-13: qty 1000

## 2017-04-13 MED ORDER — ZOLPIDEM TARTRATE 5 MG PO TABS: 5.0000 mg | ORAL_TABLET | Freq: Every evening | ORAL | Status: DC | PRN

## 2017-04-13 MED ORDER — DIBUCAINE 1 % RE OINT: 1.0000 "application " | TOPICAL_OINTMENT | RECTAL | Status: DC | PRN

## 2017-04-13 MED ORDER — COCONUT OIL OIL: 1.0000 "application " | TOPICAL_OIL | Status: DC | PRN

## 2017-04-13 MED ORDER — OXYCODONE-ACETAMINOPHEN 5-325 MG PO TABS: 1.0000 | ORAL_TABLET | ORAL | Status: DC | PRN

## 2017-04-13 MED ORDER — DIPHENHYDRAMINE HCL 25 MG PO CAPS: 25.0000 mg | ORAL_CAPSULE | Freq: Four times a day (QID) | ORAL | Status: DC | PRN

## 2017-04-13 MED ORDER — OXYCODONE-ACETAMINOPHEN 5-325 MG PO TABS: 2.0000 | ORAL_TABLET | ORAL | Status: DC | PRN

## 2017-04-13 MED ORDER — TERBUTALINE SULFATE 1 MG/ML IJ SOLN
0.2500 mg | Freq: Once | INTRAMUSCULAR | Status: DC | PRN
  Filled 2017-04-13: qty 1

## 2017-04-13 MED ORDER — MISOPROSTOL 25 MCG QUARTER TABLET
25.0000 ug | ORAL_TABLET | ORAL | Status: DC | PRN
  Filled 2017-04-13 (×2): qty 1

## 2017-04-13 MED ORDER — LACTATED RINGERS IV SOLN: 500.0000 mL | INTRAVENOUS | Status: DC | PRN

## 2017-04-13 MED ORDER — FLEET ENEMA 7-19 GM/118ML RE ENEM: 1.0000 | ENEMA | RECTAL | Status: DC | PRN

## 2017-04-13 MED ORDER — OXYTOCIN 40 UNITS IN LACTATED RINGERS INFUSION - SIMPLE MED
1.0000 m[IU]/min | INTRAVENOUS | Status: DC
  Administered 2017-04-13: 2 m[IU]/min via INTRAVENOUS

## 2017-04-13 MED ORDER — FENTANYL CITRATE (PF) 100 MCG/2ML IJ SOLN
100.0000 ug | INTRAMUSCULAR | Status: DC | PRN
  Administered 2017-04-13: 100 ug via INTRAVENOUS
  Filled 2017-04-13: qty 2

## 2017-04-13 MED ORDER — TETANUS-DIPHTH-ACELL PERTUSSIS 5-2.5-18.5 LF-MCG/0.5 IM SUSP: 0.5000 mL | Freq: Once | INTRAMUSCULAR | Status: DC

## 2017-04-13 MED ORDER — LIDOCAINE HCL (PF) 1 % IJ SOLN
30.0000 mL | INTRAMUSCULAR | Status: DC | PRN
  Filled 2017-04-13: qty 30

## 2017-04-13 MED ORDER — BENZOCAINE-MENTHOL 20-0.5 % EX AERO
1.0000 "application " | INHALATION_SPRAY | CUTANEOUS | Status: DC | PRN
  Filled 2017-04-13: qty 56

## 2017-04-13 MED ORDER — SENNOSIDES-DOCUSATE SODIUM 8.6-50 MG PO TABS
2.0000 | ORAL_TABLET | ORAL | Status: DC
  Administered 2017-04-13 – 2017-04-15 (×2): 2 via ORAL
  Filled 2017-04-13 (×2): qty 2

## 2017-04-13 MED ORDER — WITCH HAZEL-GLYCERIN EX PADS: 1.0000 "application " | MEDICATED_PAD | CUTANEOUS | Status: DC | PRN

## 2017-04-13 MED ORDER — ONDANSETRON HCL 4 MG PO TABS: 4.0000 mg | ORAL_TABLET | ORAL | Status: DC | PRN

## 2017-04-13 MED ORDER — SIMETHICONE 80 MG PO CHEW: 80.0000 mg | CHEWABLE_TABLET | ORAL | Status: DC | PRN

## 2017-04-13 MED ORDER — ACETAMINOPHEN 325 MG PO TABS: 650.0000 mg | ORAL_TABLET | ORAL | Status: DC | PRN

## 2017-04-13 MED ORDER — MEASLES, MUMPS & RUBELLA VAC ~~LOC~~ INJ
0.5000 mL | INJECTION | Freq: Once | SUBCUTANEOUS | Status: AC
  Administered 2017-04-15: 0.5 mL via SUBCUTANEOUS
  Filled 2017-04-13: qty 0.5

## 2017-04-13 MED ORDER — SOD CITRATE-CITRIC ACID 500-334 MG/5ML PO SOLN: 30.0000 mL | ORAL | Status: DC | PRN

## 2017-04-13 MED ORDER — ONDANSETRON HCL 4 MG/2ML IJ SOLN: 4.0000 mg | Freq: Four times a day (QID) | INTRAMUSCULAR | Status: DC | PRN

## 2017-04-13 MED ORDER — ONDANSETRON HCL 4 MG/2ML IJ SOLN: 4.0000 mg | INTRAMUSCULAR | Status: DC | PRN

## 2017-04-13 MED ORDER — IBUPROFEN 600 MG PO TABS
600.0000 mg | ORAL_TABLET | Freq: Four times a day (QID) | ORAL | Status: DC
  Administered 2017-04-13 – 2017-04-15 (×6): 600 mg via ORAL
  Filled 2017-04-13 (×7): qty 1

## 2017-04-13 MED ORDER — OXYTOCIN BOLUS FROM INFUSION
500.0000 mL | Freq: Once | INTRAVENOUS | Status: AC
  Administered 2017-04-13: 500 mL via INTRAVENOUS

## 2017-04-13 MED ORDER — ACETAMINOPHEN 325 MG PO TABS
650.0000 mg | ORAL_TABLET | ORAL | Status: DC | PRN
  Administered 2017-04-14 (×2): 650 mg via ORAL
  Filled 2017-04-13 (×2): qty 2

## 2017-04-13 NOTE — Progress Notes (Signed)
Admission teaching done via live interpreter. All questions answered. Call bell and emergency button explained. Patient does not want to order food from cafeteria. No blood clots expressed.

## 2017-04-13 NOTE — H&P (Signed)
OBSTETRIC ADMISSION HISTORY AND PHYSICAL  [name] [name] is a 31 y.o. female [phone] with IUP at 6041w0d by LMP presenting for IOL secondary to umbilical vein varix noted on ultrasound yesterday. No filling defects identified.  [facility], history of preterm labor so received 17-P injections, and rubella non-immune status.  Also had mild polyhydramnios. She reports +FMs, No LOF, no VB, no blurry vision, headaches or peripheral edema or RUQ pain. She reports feeling contractions every approximately every 30 minutes that began last night.  She plans on breast and bottle feeding. She request depo for birth control. [facility]   Dating: By 19week U/S --->  Estimated Date of Delivery: 05/04/17  Sono:   @[redacted]w[redacted]d , CWD, normal anatomy, cephalic presentation, umbilical vein varix, mild polyhydramnios (AFI 28) @[redacted]w[redacted]d  2803g, 63% EFW  Prenatal History/Complications:  [facility]  Dating LMP  Blood type:   O pos  Genetic Screen N/A, normal anatomy scan via MFM   Antibody: neg  Anatomic US WNL  - Poly Rubella:  Immune  GTT  Third trimester: 70/80/68 RPR:   NR  Flu vaccine  HBsAg:   Neg  TDaP vaccine  02/17/17                       Rhogam: N/A HIV:   NR  Baby Food  breast and bottle feeding                                          GBS: (For PCN allergy, check sensitivities)  Contraception  depo-provera Pap: negative 11/2016  Circumcision  boy -no circ   Pediatrician [facility]   Support Person  Nor Reh (husband)     H/o previous preterm delivery at 31 weeks; received 17-p injections this pregnancy  H/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 History of stillbirth at 24 weeks 08/05/2011   24 weeks, possibly 2/2 Toxo. Per MFM consult pt needs serial growth US's. Antenatal testing not indicated 2/2 Prev IUFD <28 weeks [x] 23  40%tile [x] 32 scheduled [ ] 36  scheduled  Recommendations 1) No further work up needed due to previous IUFD (unexplained at this point).  Had normal APS work up with her last pregnancy. 2) Recommend serial ultrasounds for growth given history of previous IUFD     . Preterm labor     Past Surgical History: Past Surgical History:  Procedure Laterality Date  . NO PAST SURGERIES      Obstetrical History: OB History    Gravida Para Term Preterm AB Living   4 3 1 2  0 2   SAB TAB Ectopic Multiple Live Births   0 0 0 0 2      Social History: Social History   Social History  . Marital status: Married    Spouse name: N/A  . Number of children: N/A  . Years of education: N/A   Social History Main Topics  . Smoking status: Never Smoker  . Smokeless tobacco: Never Used  . Alcohol use No  . Drug use: No  . Sexual activity: Not Currently    Birth control/ protection: None     Comment: delivered fetal demise 08/04/11   Other Topics Concern  . None  Social History Narrative  . None    Family History: Family History  Problem Relation Age of Onset  . Kidney disease Father     Allergies: No Known Allergies  Prescriptions Prior to Admission  Medication Sig Dispense Refill Last Dose  . Prenatal Vit-Fe Fumarate-FA (PRENATAL VITAMINS PLUS) 27-1 MG TABS Take 1 tablet by mouth daily. 30 tablet 2 Taking     Review of Systems  All systems reviewed and negative except as stated in HPI  Blood pressure 98/66, pulse 86, temperature 98.3 F (36.8 C), temperature source Oral, resp. rate 18, height 5\' 2"  (1.575 m), weight 77.1 kg (170 lb), last menstrual period 07/20/2016. General appearance: alert and cooperative Lungs: clear to auscultation bilaterally Heart: regular rate and rhythm Abdomen: soft, non-tender; bowel sounds normal Pelvic: deferred Extremities: Homans sign is negative, no sign of DVT Presentation: cephalic Fetal monitoringBaseline: 125 bpm, Variability: Good {> 6  bpm), Accelerations: Reactive, Decelerations: Absent and absent Uterine activity irregular Dilation: 4.5 Effacement (%): 70 Station: -2 Exam by:: Valentina Lucks, RN   Prenatal labs: ABO, Rh: --/--/O POS (09/05 0759) Antibody: PENDING (09/05 0759) Rubella: <0.90 (04/24 0853) RPR: Non Reactive (06/28 0843)  HBsAg: Negative (04/24 0853)  HIV:   Non-Reactive GBS: Negative (08/29 0000)  2 hr Glucola WNL (02/03/17) Genetic screening  N/A Anatomy US WNL (12/14/16)  Prenatal Transfer Tool  Maternal Diabetes: No Genetic Screening: Declined Maternal Ultrasounds/Referrals: Abnormal:  Findings:   Other: polyhydramnios Fetal Ultrasounds or other Referrals:  Referred to Materal Fetal Medicine  Maternal Substance Abuse:  No Significant Maternal Medications:  None Significant Maternal Lab Results: Lab values include: Other:  Rubella Nonimmune, GBS negative  Results for orders placed or performed during the hospital encounter of 04/13/17 (from the past 24 hour(s))  CBC   Collection Time: 04/13/17  7:59 AM  Result Value Ref Range   WBC 7.8 4.0 - 10.5 K/uL   RBC 4.60 3.87 - 5.11 MIL/uL   Hemoglobin 14.0 12.0 - 15.0 g/dL   HCT 13.2 44.0 - 10.2 %   MCV 89.6 78.0 - 100.0 fL   MCH 30.4 26.0 - 34.0 pg   MCHC 34.0 30.0 - 36.0 g/dL   RDW 72.5 36.6 - 44.0 %   Platelets 164 150 - 400 K/uL  Type and screen [facility]   Collection Time: 04/13/17  7:59 AM  Result Value Ref Range   ABO/RH(D) O POS    Antibody Screen PENDING    Sample Expiration 04/16/2017     Patient Active Problem List   Diagnosis Date Noted  . Polyhydramnios affecting pregnancy 04/13/2017  . Rubella non-immune status, antepartum 12/28/2016  . Polyhydramnios in third trimester, antepartum complication 12/04/2012  . Supervision of high risk pregnancy, antepartum 09/14/2012  . History of stillbirth at 41 weeks 08/05/2011  . Pregnancy with history of pre-term labor 07/08/2011    Assessment/Plan:  Ardeth Repetto is a 31  y.o. H4V4259 at [redacted]w[redacted]d here for IOL umbilical vein varix noted on ultrasound.    #Labor: Contracting irregularly with cervical change noted since evaluation prior to admission; will augment with pitocin now #Pain: Supportive care, pain medictions upon maternal request #FWB: Category 1 #ID:  GBS negative #MOF: Breast and bottle #MOC: Depo-provera #Circ:  Not desired  Larene Beach, DO  04/13/2017, 8:53 AM  OB FELLOW HISTORY AND PHYSICAL ATTESTATION  I confirm that I have verified the information documented in the resident's note and that I have also personally reperformed the physical exam and  all medical decision making activities. I agree with above documentation and have made edits as needed.   Caryl Ada, DO OB Fellow 04/13/2017, 12:27 PM

## 2017-04-13 NOTE — Progress Notes (Signed)
Courteney Sligar is a 31 y.o. [id number] at 3529w0d by 19 week U/S admitted for IOL for umbilical vein varix. She also has mild polyhydramnios.  Subjective: Feeling more uncomfortable with contractions.  Fentanyl PRN ordered.  She continues to decline epidural.   Objective: BP 109/76   Pulse 79   Temp 98.3 F (36.8 C) (Oral)   Resp 20   Ht 5\' 2"  (1.575 m)   Wt 77.1 kg (170 lb)   LMP 07/20/2016 (Approximate)   BMI 31.09 kg/m  No intake/output data recorded. No intake/output data recorded.  FHT:  Baseline 130, mod variability, +accels, early decels UC:   regular, every 2 minutes SVE:   Dilation: 5.5 Effacement (%): 90 Station: -1 Exam by:: Valentina Lucks. Woods, RN  Labs: Lab Results  Component Value Date   WBC 7.8 04/13/2017   HGB 14.0 04/13/2017   HCT 41.2 04/13/2017   MCV 89.6 04/13/2017   PLT 164 04/13/2017    Assessment / Plan: IOL for umbilical vein varix, progressing well on pitocin  Labor: Progressing well on pitocin, AROM performed at approximately 1300 with return of clear fluid. Preeclampsia:  N/A Fetal Wellbeing:  Category I Pain Control:  IV pain meds I/D:  n/a Anticipated MOD:  NSVD  Jearld LeschMary K Kaesen Rodriguez 04/13/2017, 1:04 PM

## 2017-04-13 NOTE — Anesthesia Pain Management Evaluation Note (Signed)
  CRNA Pain Management Visit Note  Patient: [name] [name], [age] y.o., female  "Hello [facility] am a [facility]. We have an anesthesia team available at all times to provide care throughout the hospital, including epidural management and anesthesia for C-section. I don't know your plan for the delivery whether it a natural birth, water birth, IV sedation, nitrous supplementation, doula or epidural, but we want to meet your pain goals."   1.Was your pain managed to your expectations on prior hospitalizations?   Yes   2.What is your expectation for pain management during this hospitalization?     IV pain meds  3.How can we help you reach that goal? Unsure.  Information gained through [employer] and videotranslator.  Record the patient's initial score and the patient's pain goal.   Pain: 8  Pain Goal: 9 [facility] wants you to be able to say your pain was always managed very well.  Cephus ShellingBURGER,Varian Innes 04/13/2017

## 2017-04-14 NOTE — Progress Notes (Signed)
[employer] Interpreter [phone] used. RN introduced, meal ordered. Asked Mom about Baby getting a bath. Mom agreed skin to skin for 1 hour explained. Also Explained Baby will need PKU , congential heart screen and Tcb at 24 hours of age. Mom states understanding. Mom as no questions at this time.

## 2017-04-14 NOTE — Progress Notes (Signed)
Post Partum Day 1  Subjective:  [name] [name] is a 31 y.o. [id number] 4145w0d s/p SVD.  No acute events overnight.  Pt denies problems with ambulating, voiding or po intake.  She denies nausea or vomiting.  Pain is well controlled.  She has had flatus. She has not had bowel movement.  Lochia Moderate.  Plan for birth control is Depo-Provera.  Method of Feeding: breast  Objective: BP (!) 86/51 (BP Location: Left Arm)   Pulse 73   Temp 98 F (36.7 C) (Oral)   Resp 18   Ht 5\' 2"  (1.575 m)   Wt 77.1 kg (170 lb)   LMP 07/20/2016 (Approximate)   SpO2 96%   Breastfeeding? Unknown   BMI 31.09 kg/m   Physical Exam:  General: alert, cooperative and no distress Lochia:normal flow Chest: no increased work of breathing Abdomen: soft, nontender, fundus firm at/below umbilicus Uterine Fundus: firm DVT Evaluation: No evidence of DVT seen on physical exam. Extremities: No edema   Recent Labs  04/13/17 0759  HGB 14.0  HCT 41.2    Assessment/Plan:  ASSESSMENT: [name] [name] is a 31 y.o. H4V4259G4P2203 5245w0d ppd #1 s/p NSVD doing well.   Plan for discharge tomorrow   LOS: 1 day   Amanda C. Frances FurbishWinfrey, MD PGY-1, [facility] 04/14/2017 8:32 AM

## 2017-04-14 NOTE — Lactation Note (Signed)
This note was copied from a baby's chart. Lactation Consultation Note  Patient Name: [name] [name] Today's Date: 04/14/2017 Reason for consult: Initial assessment Baby at 22 hr of life. Mom would like to bf but does not think she has any milk "yet". Demonstrated manual expression, colostrum noted bilaterally. Mom did not bf her 1st child "because I had no milk". She bf along with formula fed her 2nd and 3rd child. Discussed LPT baby behavior, feeding frequency, supplementing volume guidelines, baby belly size, voids, wt loss, breast changes, and nipple care. Mom stated she does not want to pump at this visit. Instructed mom to call at the next bf so that lactation can view a feeding. Mom will offer the breast on demand q3hr and f/u each bf with formula per guidelines.     Maternal Data Has patient been taught Hand Expression?: Yes Does the patient have breastfeeding experience prior to this delivery?: Yes  Feeding Feeding Type: Breast Fed  LATCH Score Latch: Grasps breast easily, tongue down, lips flanged, rhythmical sucking.  Audible Swallowing: A few with stimulation  Type of Nipple: Everted at rest and after stimulation  Comfort (Breast/Nipple): Soft / non-tender  Hold (Positioning): No assistance needed to correctly position infant at breast.  LATCH Score: 9  Interventions    Lactation Tools Discussed/Used WIC Program: Yes   Consult Status Consult Status: Follow-up Date: 04/15/17 Follow-up type: In-patient    Rulon Eisenmengerlizabeth E Amine Adelson 04/14/2017, 12:27 PM

## 2017-04-15 MED ORDER — ACETAMINOPHEN 325 MG PO TABS: 650.0000 mg | ORAL_TABLET | Freq: Four times a day (QID) | ORAL | 0 refills | Status: DC | PRN

## 2017-04-15 MED ORDER — IBUPROFEN 600 MG PO TABS: 600.0000 mg | ORAL_TABLET | Freq: Four times a day (QID) | ORAL | 0 refills | Status: DC | PRN

## 2017-04-15 NOTE — Discharge Instructions (Signed)

## 2017-04-15 NOTE — Progress Notes (Signed)
Discharge instructions performed, parents declined interpreter and asked questions during discharge instructions and demonstrated understanding.

## 2017-04-15 NOTE — Discharge Summary (Signed)
OB Discharge Summary     Patient Name: [name] [name] DOB: 1985/09/09 MRN: [id number]  Date of admission: 04/13/2017 Delivering MD: Luiz Blare Y   Date of discharge: 04/15/2017  Admitting diagnosis: INDUCTION Intrauterine pregnancy: [redacted]w[redacted]d    Secondary diagnosis:  Principal Problem:   SVD (spontaneous vaginal delivery) Active Problems:   Rubella non-immune status, antepartum   Polyhydramnios affecting pregnancy  Additional problems:  Patient Active Problem List   Diagnosis Date Noted  . SVD (spontaneous vaginal delivery) 04/15/2017  . Polyhydramnios affecting pregnancy 04/13/2017  . Rubella non-immune status, antepartum 12/28/2016  . Polyhydramnios in third trimester, antepartum complication 017/49/4496 . Supervision of high risk pregnancy, antepartum 09/14/2012  . History of stillbirth at 289 weeks12/27/2012  . Pregnancy with history of pre-term labor 07/08/2011       Discharge diagnosis: Term Pregnancy Delivered  Post partum procedures:MMR  Augmentation: AROM and Pitocin  Complications: None  Hospital course:  Induction of Labor With Vaginal Delivery   31y.o. yo G917 [id number] admitted to the hospital 04/13/2017 for induction of labor.  Indication for induction: umbilical vein varix.  Patient had an uncomplicated labor course as follows: Membrane Rupture Time/Date: 1:00 PM ,04/13/2017   Intrapartum Procedures: Episiotomy: None [1]                                         Lacerations:  Labial [10]  Patient had delivery of a Viable infant.  Information for the patient's newborn:  MeSamanth, Mirkin0[[id number]]Delivery Method: Vag-Spont   04/13/2017  Details of delivery can be found in separate delivery note.  Patient had a routine postpartum course. Patient is discharged home 04/15/17.  Physical exam  Vitals:   04/13/17 2050 04/14/17 0507 04/14/17 1817 04/15/17 0615  BP: 112/78 (!) 86/51 108/70 114/75  Pulse: 85 73 81 70  Resp: 18 18 18 20   Temp: 98.3 F (36.8 C) 98  F (36.7 C) 98.2 F (36.8 C) 97.7 F (36.5 C)  TempSrc: Oral Oral Oral Oral  SpO2:      Weight:      Height:       General: alert, cooperative and no distress Lochia: appropriate Uterine Fundus: firm Incision: N/A DVT Evaluation: No evidence of DVT seen on physical exam. Labs: Lab Results  Component Value Date   WBC 7.8 04/13/2017   HGB 14.0 04/13/2017   HCT 41.2 04/13/2017   MCV 89.6 04/13/2017   PLT 164 04/13/2017   CMP Latest Ref Rng & Units 08/05/2011  Glucose 70 - 99 mg/dL 112(H)  BUN 6 - 23 mg/dL 6  Creatinine 0.50 - 1.10 mg/dL 0.48(L)  Sodium 135 - 145 mEq/L 135  Potassium 3.5 - 5.1 mEq/L 3.3(L)  Chloride 96 - 112 mEq/L 103  CO2 19 - 32 mEq/L 21  Calcium 8.4 - 10.5 mg/dL 8.7  Total Protein 6.0 - 8.3 g/dL 7.6  Total Bilirubin 0.3 - 1.2 mg/dL 0.3  Alkaline Phos 39 - 117 U/L 123(H)  AST 0 - 37 U/L 50(H)  ALT 0 - 35 U/L 33    Discharge instruction: per After Visit Summary and "Baby and Me Booklet".  After visit meds:  Allergies as of 04/15/2017   No Known Allergies     Medication List    TAKE these medications   acetaminophen 325 MG tablet Commonly known as:  TYLENOL Take 2  tablets (650 mg total) by mouth every 6 (six) hours as needed (for pain scale < 4).   ibuprofen 600 MG tablet Commonly known as:  ADVIL,MOTRIN Take 1 tablet (600 mg total) by mouth every 6 (six) hours as needed for moderate pain or cramping.   PRENATAL VITAMINS PLUS 27-1 MG Tabs Take 1 tablet by mouth daily. What changed:  when to take this            Discharge Care Instructions        Start     Ordered   04/15/17 0000  acetaminophen (TYLENOL) 325 MG tablet  Every 6 hours PRN     04/15/17 0738   04/15/17 0000  ibuprofen (ADVIL,MOTRIN) 600 MG tablet  Every 6 hours PRN     04/15/17 0738   04/13/17 0000  OB RESULT CONSOLE Group B Strep    Comments:  This external order was created through the Results Console.   04/13/17 0846      Diet: routine diet  Activity: Advance  as tolerated. Pelvic rest for 6 weeks.   Outpatient follow up:6 weeks Follow up Appt:Future Appointments Date Time Provider [place]  05/31/2017 2:40 PM Leftwich-Kirby, Kathie Dike, CNM [facility]   Follow up Visit:No Follow-up on file.  Postpartum contraception: Depo Provera  Newborn Data: Live born female  Birth Weight: 6 lb 5.9 oz (2890 g) APGAR: 8, 9  Baby Feeding: Breast Disposition:home with mother   04/15/2017 Metta Clines, DO PGY-2  OB [place]  I have seen and examined this patient and agree with above documentation in the resident's note.   Gailen Shelter, MD OB Fellow 10:25 AM

## 2017-04-15 NOTE — Lactation Note (Addendum)
This note was copied from a baby's chart. Lactation Consultation Note  Patient Name: Boy Consuelo Justman Today's Date: 04/15/2017     Baby 44 hours old and latched in cradle position upon entering. Sucks and swallows observed. Family refused interpreter for Burmese. Mom encouraged to feed baby 8-12 times/24 hours and with feeding cues.  Encouraged mother to compress her breast during feeding to keep baby active. Reviewed engorgement care and monitoring voids/stools. Mom encouraged to feed baby 8-12 times/24 hours and with feeding cues.  Provided mother w/ manual pump.  When mother used the manual pump, good flow of transitional breastmilk was viewed.   Maternal Data    Feeding    LATCH Score                   Interventions    Lactation Tools Discussed/Used     Consult Status      Dahlia ByesBerkelhammer, Kelliann Pendergraph [facility] 04/15/2017, 11:21 AM

## 2017-04-19 ENCOUNTER — Inpatient Hospital Stay (HOSPITAL_COMMUNITY): Admission: RE | Admit: 2017-04-19 | Payer: BLUE CROSS/BLUE SHIELD | Source: Ambulatory Visit

## 2017-04-19 ENCOUNTER — Ambulatory Visit (HOSPITAL_COMMUNITY): Payer: BLUE CROSS/BLUE SHIELD

## 2017-05-31 ENCOUNTER — Ambulatory Visit (INDEPENDENT_AMBULATORY_CARE_PROVIDER_SITE_OTHER): Payer: BLUE CROSS/BLUE SHIELD | Admitting: Advanced Practice Midwife

## 2017-05-31 VITALS — BP 100/68 | HR 71 | Ht 62.0 in | Wt 149.7 lb

## 2017-05-31 DIAGNOSIS — Z8759 Personal history of other complications of pregnancy, childbirth and the puerperium: Secondary | ICD-10-CM

## 2017-05-31 DIAGNOSIS — Z1389 Encounter for screening for other disorder: Secondary | ICD-10-CM | POA: Diagnosis not present

## 2017-05-31 DIAGNOSIS — Z3202 Encounter for pregnancy test, result negative: Secondary | ICD-10-CM

## 2017-05-31 DIAGNOSIS — Z3009 Encounter for other general counseling and advice on contraception: Secondary | ICD-10-CM

## 2017-05-31 DIAGNOSIS — Z975 Presence of (intrauterine) contraceptive device: Secondary | ICD-10-CM | POA: Insufficient documentation

## 2017-05-31 DIAGNOSIS — Z3049 Encounter for surveillance of other contraceptives: Secondary | ICD-10-CM

## 2017-05-31 LAB — POCT PREGNANCY, URINE: Preg Test, Ur: NEGATIVE

## 2017-05-31 MED ORDER — ETONOGESTREL 68 MG ~~LOC~~ IMPL
68.0000 mg | DRUG_IMPLANT | Freq: Once | SUBCUTANEOUS | Status: AC
  Administered 2017-05-31: 68 mg via SUBCUTANEOUS

## 2017-05-31 NOTE — Patient Instructions (Signed)
Nexplanon Instructions After Insertion   Keep bandage clean and dry for 24 hours   May use ice/Tylenol/Ibuprofen for soreness or pain   If you develop fever, drainage or increased warmth from incision site-contact office immediately  Etonogestrel implant What is this medicine? ETONOGESTREL (et oh noe JES trel) is a contraceptive (birth control) device. It is used to prevent pregnancy. It can be used for up to 3 years. This medicine may be used for other purposes; ask your health care provider or pharmacist if you have questions. COMMON BRAND NAME(S): Implanon, Nexplanon What should I tell my health care provider before I take this medicine? They need to know if you have any of these conditions: -abnormal vaginal bleeding -blood vessel disease or blood clots -cancer of the breast, cervix, or liver -depression -diabetes -gallbladder disease -headaches -heart disease or recent heart attack -high blood pressure -high cholesterol -kidney disease -liver disease -renal disease -seizures -tobacco smoker -an unusual or allergic reaction to etonogestrel, other hormones, anesthetics or antiseptics, medicines, foods, dyes, or preservatives -pregnant or trying to get pregnant -breast-feeding How should I use this medicine? This device is inserted just under the skin on the inner side of your upper arm by a health care professional. Talk to your pediatrician regarding the use of this medicine in children. Special care may be needed. Overdosage: If you think you have taken too much of this medicine contact a poison control center or emergency room at once. NOTE: This medicine is only for you. Do not share this medicine with others. What if I miss a dose? This does not apply. What may interact with this medicine? Do not take this medicine with any of the following medications: -amprenavir -bosentan -fosamprenavir This medicine may also interact with the following  medications: -barbiturate medicines for inducing sleep or treating seizures -certain medicines for fungal infections like ketoconazole and itraconazole -grapefruit juice -griseofulvin -medicines to treat seizures like carbamazepine, felbamate, oxcarbazepine, phenytoin, topiramate -modafinil -phenylbutazone -rifampin -rufinamide -some medicines to treat HIV infection like atazanavir, indinavir, lopinavir, nelfinavir, tipranavir, ritonavir -St. John's wort This list may not describe all possible interactions. Give your health care provider a list of all the medicines, herbs, non-prescription drugs, or dietary supplements you use. Also tell them if you smoke, drink alcohol, or use illegal drugs. Some items may interact with your medicine. What should I watch for while using this medicine? This product does not protect you against HIV infection (AIDS) or other sexually transmitted diseases. You should be able to feel the implant by pressing your fingertips over the skin where it was inserted. Contact your doctor if you cannot feel the implant, and use a non-hormonal birth control method (such as condoms) until your doctor confirms that the implant is in place. If you feel that the implant may have broken or become bent while in your arm, contact your healthcare provider. What side effects may I notice from receiving this medicine? Side effects that you should report to your doctor or health care professional as soon as possible: -allergic reactions like skin rash, itching or hives, swelling of the face, lips, or tongue -breast lumps -changes in emotions or moods -depressed mood -heavy or prolonged menstrual bleeding -pain, irritation, swelling, or bruising at the insertion site -scar at site of insertion -signs of infection at the insertion site such as fever, and skin redness, pain or discharge -signs of pregnancy -signs and symptoms of a blood clot such as breathing problems; changes in  vision; chest pain; severe,   sudden headache; pain, swelling, warmth in the leg; trouble speaking; sudden numbness or weakness of the face, arm or leg -signs and symptoms of liver injury like dark yellow or brown urine; general ill feeling or flu-like symptoms; light-colored stools; loss of appetite; nausea; right upper belly pain; unusually weak or tired; yellowing of the eyes or skin -unusual vaginal bleeding, discharge -signs and symptoms of a stroke like changes in vision; confusion; trouble speaking or understanding; severe headaches; sudden numbness or weakness of the face, arm or leg; trouble walking; dizziness; loss of balance or coordination Side effects that usually do not require medical attention (report to your doctor or health care professional if they continue or are bothersome): -acne -back pain -breast pain -changes in weight -dizziness -general ill feeling or flu-like symptoms -headache -irregular menstrual bleeding -nausea -sore throat -vaginal irritation or inflammation This list may not describe all possible side effects. Call your doctor for medical advice about side effects. You may report side effects to FDA at 1-800-FDA-1088. Where should I keep my medicine? This drug is given in a hospital or clinic and will not be stored at home. NOTE: This sheet is a summary. It may not cover all possible information. If you have questions about this medicine, talk to your doctor, pharmacist, or health care provider.  2018 Elsevier/Gold Standard (2016-02-12 11:19:22)  

## 2017-05-31 NOTE — Progress Notes (Signed)
Subjective:     [name] [name] is a 31 y.o. female who presents for a postpartum visit. She is 6 weeks postpartum following a spontaneous vaginal delivery. I have fully reviewed the prenatal and intrapartum course. The delivery was at 37 gestational weeks. Outcome: spontaneous vaginal delivery. Anesthesia: IV sedation. Postpartum course has been normal. Baby's course has been normal. Baby is feeding by both breast and bottle - Similac Advance. Bleeding no bleeding. Bowel function is normal. Bladder function is normal. Patient is not sexually active. Contraception method is none. Postpartum depression screening: negative.  The following portions of the patient's history were reviewed and updated as appropriate: allergies, current medications, past family history, past medical history, past social history, past surgical history and problem list.  Review of Systems Pertinent items noted in HPI and remainder of comprehensive ROS otherwise negative.   Objective:    BP 100/68   Pulse 71   Ht 5\' 2"  (1.575 m)   Wt 149 lb 11.2 oz (67.9 kg)   LMP  (LMP Unknown)   BMI 27.38 kg/m   VS reviewed, nursing note reviewed,  Constitutional: well developed, well nourished, no distress HEENT: normocephalic CV: normal rate Pulm/chest wall: normal effort Abdomen: soft Neuro: alert and oriented x 3 Skin: warm, dry Psych: affect normal    Nexplanon Insertion Procedure Patient identified, informed consent performed, consent signed.   Patient does understand that irregular bleeding is a very common side effect of this medication. She was advised to have backup contraception for one week after placement. Pregnancy test in clinic today was negative.  Appropriate time out taken.  Patient's left arm was prepped and draped in the usual sterile fashion.. The ruler used to measure and mark insertion area.  Patient was prepped with alcohol swab and then injected with 3 ml of 1% lidocaine.  She was prepped with betadine,  Nexplanon removed from packaging,  Device confirmed in needle, then inserted full length of needle and withdrawn per handbook instructions. Nexplanon was able to palpated in the patient's arm; patient palpated the insert herself. There was minimal blood loss.  Patient insertion site covered with guaze and a pressure bandage to reduce any bruising.  The patient tolerated the procedure well and was given post procedure instructions.        Sharen CounterLisa Leftwich-Kirby, CNM 4:56 PM  Assessment:   1. Postpartum care and examination  - etonogestrel (NEXPLANON) implant 68 mg; 68 mg by Subdermal route once.  2. Encounter for counseling regarding contraception  - Pregnancy, urine POC - etonogestrel (NEXPLANON) implant 68 mg; 68 mg by Subdermal route once.  Plan:    1. Contraception: Nexplanon 2. Follow up in: 1 year or as needed.

## 2019-11-03 ENCOUNTER — Ambulatory Visit: Payer: Medicaid Other | Attending: Internal Medicine

## 2019-11-03 DIAGNOSIS — Z23 Encounter for immunization: Secondary | ICD-10-CM

## 2019-11-03 NOTE — Progress Notes (Signed)
   Covid-19 Vaccination Clinic  Name:  [name] [name]    MRN: [id number] DOB: [birth year]  11/03/2019  [name] was observed post Covid-19 immunization for 15 minutes without incident. She was provided with Vaccine Information Sheet and instruction to access the V-Safe system.   [name] was instructed to call 911 with any severe reactions post vaccine: Marland Kitchen Difficulty breathing  . Swelling of face and throat  . A fast heartbeat  . A bad rash all over body  . Dizziness and weakness

## 2020-05-06 ENCOUNTER — Other Ambulatory Visit: Payer: Self-pay

## 2020-05-06 ENCOUNTER — Encounter: Payer: Self-pay | Admitting: Nurse Practitioner

## 2020-05-06 ENCOUNTER — Ambulatory Visit (INDEPENDENT_AMBULATORY_CARE_PROVIDER_SITE_OTHER): Payer: BLUE CROSS/BLUE SHIELD | Admitting: Nurse Practitioner

## 2020-05-06 ENCOUNTER — Other Ambulatory Visit (HOSPITAL_COMMUNITY)
Admission: RE | Admit: 2020-05-06 | Discharge: 2020-05-06 | Disposition: A | Discharge status: Home / Self Care | Payer: BLUE CROSS/BLUE SHIELD | Source: Ambulatory Visit | Attending: Nurse Practitioner | Admitting: Nurse Practitioner

## 2020-05-06 VITALS — BP 119/80 | HR 87 | Ht 64.0 in | Wt 179.5 lb

## 2020-05-06 DIAGNOSIS — Z3042 Encounter for surveillance of injectable contraceptive: Secondary | ICD-10-CM

## 2020-05-06 DIAGNOSIS — Z01419 Encounter for gynecological examination (general) (routine) without abnormal findings: Secondary | ICD-10-CM | POA: Insufficient documentation

## 2020-05-06 DIAGNOSIS — Z3046 Encounter for surveillance of implantable subdermal contraceptive: Secondary | ICD-10-CM

## 2020-05-06 DIAGNOSIS — Z6830 Body mass index (BMI) 30.0-30.9, adult: Secondary | ICD-10-CM

## 2020-05-06 MED ORDER — MEDROXYPROGESTERONE ACETATE 150 MG/ML IM SUSP
150.0000 mg | Freq: Once | INTRAMUSCULAR | Status: AC
  Administered 2020-05-06: 09:00:00 150 mg via INTRAMUSCULAR

## 2020-05-06 NOTE — Progress Notes (Signed)
GYNECOLOGY ANNUAL PREVENTATIVE CARE ENCOUNTER NOTE  Subjective:   [name] [name] is a 34 y.o. [phone] female here for a routine annual gynecologic exam.  Current complaints: Wants Nexplanon removed and wants to continue with Depo.  Has used Nexplanon for 3 years..   Denies abnormal vaginal bleeding, discharge, pelvic pain, problems with intercourse or other gynecologic concerns.    Gynecologic History No LMP recorded. (Menstrual status: IUD). Contraception: Depo-Provera injections to begin today Last Pap: 3 years ago. Results were: normal   Obstetric History OB History  Gravida Para Term Preterm AB Living  4 4 2 2  0 3  SAB TAB Ectopic Multiple Live Births  0 0 0 0 3    # Outcome Date GA Lbr Len/2nd Weight Sex Delivery Anes PTL Lv  4 Term 04/13/17 [redacted]w[redacted]d 02:45 / 00:06 6 lb 5.9 oz (2.89 kg) M Vag-Spont None  LIV  3 Term 02/01/13 [redacted]w[redacted]d 06:22 / 00:06 6 lb 1 oz (2.75 kg) F Vag-Spont None  LIV     Birth Comments: WNL  2 Preterm 08/06/11 [redacted]w[redacted]d  1 lb 12 oz (0.794 kg) F Vag-Spont None  FD  1 Preterm 11/05/10 [redacted]w[redacted]d 01:00 3 lb 1 oz (1.389 kg) M Vag-Spont   LIV     Birth Comments: PTL, PTD    Past Medical History:  Diagnosis Date  . History of stillbirth at 24 weeks 08/05/2011   24 weeks, possibly 2/2 Toxo. Per MFM consult pt needs serial growth US's. Antenatal testing not indicated 2/2 Prev IUFD <28 weeks [x] 23 40%tile [x] 32 scheduled [ ] 36  scheduled  Recommendations 1) No further work up needed due to previous IUFD (unexplained at this point).  Had normal APS work up with her last pregnancy. 2) Recommend serial ultrasounds for growth given history of previous IUFD     . Preterm labor     Past Surgical History:  Procedure Laterality Date  . NO PAST SURGERIES      Current Outpatient Medications on File Prior to Visit  Medication Sig Dispense Refill  . acetaminophen (TYLENOL) 325 MG tablet Take 2 tablets (650 mg total) by mouth every 6 (six) hours as needed (for pain scale < 4). 30 tablet  0  . ibuprofen (ADVIL,MOTRIN) 600 MG tablet Take 1 tablet (600 mg total) by mouth every 6 (six) hours as needed for moderate pain or cramping. (Patient not taking: Reported on 05/31/2017) 30 tablet 0  . Prenatal Vit-Fe Fumarate-FA (PRENATAL VITAMINS PLUS) 27-1 MG TABS Take 1 tablet by mouth daily. (Patient taking differently: Take 1 tablet by mouth at bedtime. ) 30 tablet 2   No current facility-administered medications on file prior to visit.    No Known Allergies  Social History   Socioeconomic History  . Marital status: Married    Spouse name: Not on file  . Number of children: Not on file  . Years of education: Not on file  . Highest education level: Not on file  Occupational History  . Not on file  Tobacco Use  . Smoking status: Never Smoker  . Smokeless tobacco: Never Used  Substance and Sexual Activity  . Alcohol use: No  . Drug use: No  . Sexual activity: Not Currently    Birth control/protection: None    Comment: delivered fetal demise 08/04/11  Other Topics Concern  . Not on file  Social History Narrative  . Not on file   Social Determinants of Health   Financial Resource Strain:   . Difficulty of Paying  Living Expenses: Not on file  Food Insecurity:   . Worried About Programme researcher, broadcasting/film/video in the Last Year: Not on file  . Ran Out of Food in the Last Year: Not on file  Transportation Needs:   . Lack of Transportation (Medical): Not on file  . Lack of Transportation (Non-Medical): Not on file  Physical Activity:   . Days of Exercise per Week: Not on file  . Minutes of Exercise per Session: Not on file  Stress:   . Feeling of Stress : Not on file  Social Connections:   . Frequency of Communication with Friends and Family: Not on file  . Frequency of Social Gatherings with Friends and Family: Not on file  . Attends Religious Services: Not on file  . Active Member of Clubs or Organizations: Not on file  . Attends Banker Meetings: Not on file  .  Marital Status: Not on file  Intimate Partner Violence:   . Fear of Current or Ex-Partner: Not on file  . Emotionally Abused: Not on file  . Physically Abused: Not on file  . Sexually Abused: Not on file    Family History  Problem Relation Age of Onset  . Kidney disease Father     The following portions of the patient's history were reviewed and updated as appropriate: allergies, current medications, past family history, past medical history, past social history, past surgical history and problem list.  Review of Systems Pertinent items noted in HPI and remainder of comprehensive ROS otherwise negative.   Objective:  BP 119/80   Pulse 87   Ht 5\' 4"  (1.626 m)   Wt 179 lb 8 oz (81.4 kg)   BMI 30.81 kg/m  CONSTITUTIONAL: Well-developed, well-nourished female in no acute distress.  HENT:  Normocephalic, atraumatic, External right and left ear normal.  EYES: Conjunctivae and EOM are normal. Pupils are equal, round.  No scleral icterus.  NECK: Normal range of motion, supple, no masses.  Normal thyroid.  SKIN: Skin is warm and dry. No rash noted. Not diaphoretic. No erythema. No pallor. NEUROLOGIC: Alert and oriented to person, place, and time. Normal reflexes, muscle tone coordination. No cranial nerve deficit noted. PSYCHIATRIC: Normal mood and affect. Normal behavior. Normal judgment and thought content. CARDIOVASCULAR: Normal heart rate noted, regular rhythm RESPIRATORY: Clear to auscultation bilaterally. Effort and breath sounds normal, no problems with respiration noted. BREASTS: Symmetric in size. No masses, skin changes, nipple drainage, or lymphadenopathy. ABDOMEN: Soft, no distention noted.  No tenderness, rebound or guarding.  PELVIC: Normal appearing external genitalia; normal appearing vaginal mucosa and cervix.  No abnormal discharge noted.  Pap smear obtained.  Normal uterine size, no other palpable masses, no uterine or adnexal tenderness. MUSCULOSKELETAL: Normal range  of motion. No tenderness.  No cyanosis, clubbing, or edema.    Nexplanon Removal Patient identified, informed consent performed, consent signed.   Appropriate time out taken. Nexplanon site identified.  Area prepped in usual sterile fashon. One ml of 1% lidocaine was used to anesthetize the area at the distal end of the implant. A small stab incision was made right beside the implant on the distal portion.  The Nexplanon rod was grasped using hemostats and removed without difficulty.  There was minimal blood loss. There were no complications.   Steri-strips were applied over the small incision.  A pressure bandage was applied to reduce any bruising.  The patient tolerated the procedure well and was given post procedure instructions.  Patient is  planning to use Depo for contraception/attempt conception.  Advised to use condoms for 2 weeks wihile Depo becomes active to avoid a pregnancy while changing contraceptive methods.     Assessment and Plan:  1. Women's annual routine gynecological examination  - Cytology - PAP( [place])  2. Encounter for surveillance of injectable contraceptive Given today. Return in 3 months - medroxyPROGESTERone (DEPO-PROVERA) injection 150 mg  3. BMI 30.0-30.9,adult Advised healthy eating and increased exercise Weight loss advised to less than BMI of 25  4. Nexplanon removal See note above  Will follow up results of pap smear and manage accordingly. Routine preventative health maintenance measures emphasized. Please refer to After Visit Summary for other counseling recommendations.    Nolene Bernheim, RN, MSN, NP-BC Nurse Practitioner, [facility]

## 2020-05-07 LAB — CYTOLOGY - PAP
Comment: NEGATIVE
Diagnosis: NEGATIVE
High risk HPV: NEGATIVE

## 2020-05-29 ENCOUNTER — Encounter: Payer: Self-pay | Admitting: *Deleted

## 2020-07-22 ENCOUNTER — Ambulatory Visit: Payer: Medicaid Other

## 2020-08-09 NOTE — L&D Delivery Note (Signed)
Delivery Note At 9:15 PM a healthy female was delivered via Vaginal, Spontaneous (Presentation: Right Occiput Anterior). Nucal chord was present x1. Baby was delivered through cord and cord was reduced at perineum before placing baby skin to skin on mother's chest. APGAR: 9, 9; weight pending.   Placenta status: Spontaneous, Intact. Delivered after 28 minutes.  Cord: 3 vessels with the following complications: None.  Cord pH: pending  Postpartum hemorrhage of occurred requiring pitocin, Cytotec 400mg  PR, 400mg  buccal, 1000mg  TXA, and 0.2mg  methergine. JADA placement was deferred, as bleeding was well controlled and uterus firm with the above treatment and fundal massage.   Anesthesia: Fentanyl intrapartum. Fentanyl and stadol given postpartum for pain during management of hemorrhage. Episiotomy: None Lacerations: None Suture Repair:  none Est. Blood Loss (mL): 1020  Mom to postpartum.  Baby to Couplet care / Skin to Skin.  , PGY-1, Faculty Service 05/27/2021, 10:33 PM

## 2020-09-30 ENCOUNTER — Other Ambulatory Visit: Payer: Self-pay

## 2020-09-30 ENCOUNTER — Ambulatory Visit (INDEPENDENT_AMBULATORY_CARE_PROVIDER_SITE_OTHER): Payer: BC Managed Care – PPO | Admitting: General Practice

## 2020-09-30 DIAGNOSIS — Z3201 Encounter for pregnancy test, result positive: Secondary | ICD-10-CM | POA: Diagnosis not present

## 2020-09-30 LAB — POCT PREGNANCY, URINE: Preg Test, Ur: POSITIVE — AB

## 2020-09-30 NOTE — Progress Notes (Signed)
Patient came by office and dropped off urine sample for UPT. UPT +.  Called patient at home with pacific interpreter [phone], no answer- left message to call us back concerning results.  Chase Caller RN BSN 09/30/20

## 2020-10-01 NOTE — Progress Notes (Signed)
Chart reviewed for nurse visit. Agree with plan of care.   Venora Maples, MD 10/01/20 9:05 AM

## 2020-10-02 ENCOUNTER — Encounter: Payer: Self-pay | Admitting: Family Medicine

## 2020-11-20 ENCOUNTER — Encounter: Payer: Self-pay | Admitting: Student

## 2020-11-20 ENCOUNTER — Ambulatory Visit (INDEPENDENT_AMBULATORY_CARE_PROVIDER_SITE_OTHER): Payer: BC Managed Care – PPO | Admitting: Student

## 2020-11-20 ENCOUNTER — Other Ambulatory Visit (HOSPITAL_COMMUNITY)
Admission: RE | Admit: 2020-11-20 | Discharge: 2020-11-20 | Disposition: A | Discharge status: Home / Self Care | Payer: BC Managed Care – PPO | Source: Ambulatory Visit | Attending: Student | Admitting: Student

## 2020-11-20 VITALS — BP 118/82 | HR 103 | Wt 178.6 lb

## 2020-11-20 DIAGNOSIS — Z31430 Encounter of female for testing for genetic disease carrier status for procreative management: Secondary | ICD-10-CM | POA: Diagnosis not present

## 2020-11-20 DIAGNOSIS — O0990 Supervision of high risk pregnancy, unspecified, unspecified trimester: Secondary | ICD-10-CM | POA: Diagnosis not present

## 2020-11-20 DIAGNOSIS — Z23 Encounter for immunization: Secondary | ICD-10-CM

## 2020-11-20 DIAGNOSIS — Z3A11 11 weeks gestation of pregnancy: Secondary | ICD-10-CM | POA: Diagnosis not present

## 2020-11-20 DIAGNOSIS — O0991 Supervision of high risk pregnancy, unspecified, first trimester: Secondary | ICD-10-CM | POA: Diagnosis not present

## 2020-11-20 DIAGNOSIS — O09211 Supervision of pregnancy with history of pre-term labor, first trimester: Secondary | ICD-10-CM

## 2020-11-20 DIAGNOSIS — Z8759 Personal history of other complications of pregnancy, childbirth and the puerperium: Secondary | ICD-10-CM

## 2020-11-20 DIAGNOSIS — Z3481 Encounter for supervision of other normal pregnancy, first trimester: Secondary | ICD-10-CM | POA: Diagnosis not present

## 2020-11-20 LAB — POCT URINALYSIS DIP (DEVICE)
Bilirubin Urine: NEGATIVE
Glucose, UA: NEGATIVE mg/dL
Nitrite: NEGATIVE
Protein, ur: NEGATIVE mg/dL
Specific Gravity, Urine: 1.025 (ref 1.005–1.030)
Urobilinogen, UA: 0.2 mg/dL (ref 0.0–1.0)
pH: 6.5 (ref 5.0–8.0)

## 2020-11-20 MED ORDER — PRENATAL VITAMIN 27-0.8 MG PO TABS: 1.0000 | ORAL_TABLET | Freq: Once | ORAL | 12 refills | Status: AC

## 2020-11-20 MED ORDER — BLOOD PRESSURE KIT DEVI: 1.0000 | 0 refills | Status: DC | PRN

## 2020-11-20 NOTE — Progress Notes (Signed)
  Subjective:    [name] [name] is being seen today for her first obstetrical visit.  This is a planned pregnancy. She is at [redacted]w[redacted]d gestation. Her obstetrical history is significant for history of fetal demise at 24 weeks. She denies history of diabetes in the past, blood pressure issues in the past. No c/sections.. Relationship with FOB: spouse, living together. Patient does intend to breast feed. Pregnancy history fully reviewed.  Patient reports no complaints. [employer] interpreter was used for the entirety of the encounter.   Review of Systems:   Review of Systems  Constitutional: Negative.   HENT: Negative.   Respiratory: Negative.   Cardiovascular: Negative.   Gastrointestinal: Negative.   Neurological: Negative.   Hematological: Negative.     Objective:     BP 118/82   Pulse (!) 103   Wt 178 lb 9.6 oz (81 kg)   LMP 09/02/2020   BMI 30.66 kg/m  Physical Exam Constitutional:      Appearance: Normal appearance.  HENT:     Head: Normocephalic.  Musculoskeletal:        General: Normal range of motion.  Skin:    General: Skin is warm.  Neurological:     General: No focal deficit present.     Mental Status: She is alert.     Exam    Assessment:    Pregnancy: L4T6256 Patient Active Problem List   Diagnosis Date Noted  . Supervision of high risk pregnancy, antepartum 11/20/2020  . History of stillbirth at 16 weeks 08/05/2011       Plan:     Initial labs drawn. Prenatal vitamins. Problem list reviewed and updated. AFP3 discussed: requested. Role of ultrasound in pregnancy discussed; fetal survey: ordered. Amniocentesis discussed: not indicated. Follow up in 4 weeks. 90% of 30 min visit spent on counseling and coordination of care.  -Declines MAkena today -pap is UTD -genetic testing done today -will do early hgA1c -All questions answered   [name] [name] [facility] 11/20/2020

## 2020-11-21 LAB — GC/CHLAMYDIA PROBE AMP (~~LOC~~) NOT AT ARMC
Chlamydia: NEGATIVE
Comment: NEGATIVE
Comment: NORMAL
Neisseria Gonorrhea: NEGATIVE

## 2020-11-21 LAB — CBC/D/PLT+RPR+RH+ABO+RUB AB...
Antibody Screen: NEGATIVE
Basophils Absolute: 0 10*3/uL (ref 0.0–0.2)
Basos: 0 %
EOS (ABSOLUTE): 0.1 10*3/uL (ref 0.0–0.4)
Eos: 2 %
HCV Ab: <0.1 s/co ratio (ref 0.0–0.9)
HIV Screen 4th Generation wRfx: NONREACTIVE
Hematocrit: 42.1 % (ref 34.0–46.6)
Hemoglobin: 13.7 g/dL (ref 11.1–15.9)
Hepatitis B Surface Ag: NEGATIVE
Immature Grans (Abs): 0 10*3/uL (ref 0.0–0.1)
Immature Granulocytes: 0 %
Lymphocytes Absolute: 1.3 10*3/uL (ref 0.7–3.1)
Lymphs: 17 %
MCH: 26.9 pg (ref 26.6–33.0)
MCHC: 32.5 g/dL (ref 31.5–35.7)
MCV: 83 fL (ref 79–97)
Monocytes Absolute: 0.7 10*3/uL (ref 0.1–0.9)
Monocytes: 9 %
Neutrophils Absolute: 5.8 10*3/uL (ref 1.4–7.0)
Neutrophils: 72 %
Platelets: 270 10*3/uL (ref 150–450)
RBC: 5.09 x10E6/uL (ref 3.77–5.28)
RDW: 15.6 % — ABNORMAL HIGH (ref 11.7–15.4)
RPR Ser Ql: NONREACTIVE
Rh Factor: POSITIVE
Rubella Antibodies, IGG: 1.51 index (ref >0.99)
WBC: 8 10*3/uL (ref 3.4–10.8)

## 2020-11-21 LAB — HCV INTERPRETATION

## 2020-11-21 LAB — HEMOGLOBIN A1C
Est. average glucose Bld gHb Est-mCnc: 108 mg/dL
Hgb A1c MFr Bld: 5.4 % (ref 4.8–5.6)

## 2020-11-22 LAB — URINE CULTURE, OB REFLEX

## 2020-11-22 LAB — CULTURE, OB URINE

## 2020-11-24 ENCOUNTER — Encounter: Payer: Self-pay | Admitting: *Deleted

## 2020-11-25 ENCOUNTER — Encounter (INDEPENDENT_AMBULATORY_CARE_PROVIDER_SITE_OTHER): Payer: Self-pay | Admitting: Primary Care

## 2020-11-25 ENCOUNTER — Other Ambulatory Visit: Payer: Self-pay

## 2020-11-25 ENCOUNTER — Ambulatory Visit (INDEPENDENT_AMBULATORY_CARE_PROVIDER_SITE_OTHER): Payer: BC Managed Care – PPO | Admitting: Primary Care

## 2020-11-25 VITALS — BP 111/76 | HR 99 | Temp 97.2°F | Ht 64.0 in | Wt 179.6 lb

## 2020-11-25 DIAGNOSIS — Z3201 Encounter for pregnancy test, result positive: Secondary | ICD-10-CM

## 2020-11-25 DIAGNOSIS — Z7689 Persons encountering health services in other specified circumstances: Secondary | ICD-10-CM

## 2020-11-25 NOTE — Progress Notes (Signed)
New Patient Office Visit  Subjective:  Patient ID: [name] [name], female    DOB: 1985-12-01  Age: 35 y.o. MRN: [id number]  CC:  Chief Complaint  Patient presents with  . New Patient (Initial Visit)    HPI [name] [name] is a 35 year old female who speaks [place] but [place] very well and understands no interpreter needed.  [name] [name] presents for establishment of care and voices no problems or concerns at this time.  Blood pressure is unremarkable without medication. Past Medical History:  Diagnosis Date  . History of stillbirth at 78 weeks 08/05/2011   24 weeks, possibly 2/2 Toxo. Per MFM consult pt needs serial growth US's. Antenatal testing not indicated 2/2 Prev IUFD <28 weeks [x] 23 40%tile [x] 32 scheduled [ ] 36  scheduled  Recommendations 1) No further work up needed due to previous IUFD (unexplained at this point).  Had normal APS work up with her last pregnancy. 2) Recommend serial ultrasounds for growth given history of previous IUFD     . Preterm labor     Past Surgical History:  Procedure Laterality Date  . NO PAST SURGERIES      Family History  Problem Relation Age of Onset  . Kidney disease Father   . Diabetes Father   . Hypertension Father     Social History   Socioeconomic History  . Marital status: Married    Spouse name: Not on file  . Number of children: Not on file  . Years of education: Not on file  . Highest education level: Not on file  Occupational History  . Not on file  Tobacco Use  . Smoking status: Never Smoker  . Smokeless tobacco: Never Used  Substance and Sexual Activity  . Alcohol use: No  . Drug use: No  . Sexual activity: Not Currently    Birth control/protection: None    Comment: delivered fetal demise 08/04/11  Other Topics Concern  . Not on file  Social History Narrative  . Not on file   Social Determinants of Health   Financial Resource Strain: Not on file  Food Insecurity: No Food Insecurity  . Worried About Charity fundraiser in  the Last Year: Never true  . Ran Out of Food in the Last Year: Never true  Transportation Needs: No Transportation Needs  . Lack of Transportation (Medical): No  . Lack of Transportation (Non-Medical): No  Physical Activity: Not on file  Stress: Not on file  Social Connections: Not on file  Intimate Partner Violence: Not on file    ROS Review of Systems  Neurological: Positive for headaches.       Spicy and salt are the cause but does not happen every time.  All other systems reviewed and are negative.   Objective:   Today's Vitals: BP 111/76 (BP Location: Right Arm, Patient Position: Sitting, Cuff Size: Normal)   Pulse 99   Temp (!) 97.2 F (36.2 C) (Temporal)   Ht 5' 4"  (1.626 m)   Wt 179 lb 9.6 oz (81.5 kg)   LMP 09/02/2020   SpO2 93%   BMI 30.83 kg/m   Physical Exam Vitals reviewed.  Constitutional:      Appearance: [name] [name] is obese.  HENT:     Head: Normocephalic.     Right Ear: Tympanic membrane and external ear normal.     Left Ear: Tympanic membrane and external ear normal.     Nose: Nose normal.  Eyes:     Extraocular Movements: Extraocular movements intact.  Pupils: Pupils are equal, round, and reactive to light.  Cardiovascular:     Rate and Rhythm: Normal rate and regular rhythm.  Pulmonary:     Effort: Pulmonary effort is normal.     Breath sounds: Normal breath sounds.  Abdominal:     General: Bowel sounds are normal. There is distension.     Palpations: Abdomen is soft.  Musculoskeletal:        General: Normal range of motion.     Cervical back: Normal range of motion and neck supple.  Neurological:     Mental Status: [name] [name] is alert and oriented to person, place, and time.  Psychiatric:        Mood and Affect: Mood normal.        Behavior: Behavior normal.        Thought Content: Thought content normal.        Judgment: Judgment normal.     Assessment & Plan:  Arrion was seen today for new patient (initial visit).  Diagnoses and all orders  for this visit:  Encounter to establish care Establish care with PCP  Positive pregnancy test -     Ambulatory referral to Obstetrics / Gynecology    Outpatient Encounter Medications as of 11/25/2020  Medication Sig  . Prenatal Vit-Fe Fumarate-FA (PRENATAL VITAMINS PLUS) 27-1 MG TABS Take 1 tablet by mouth daily.  . Blood Pressure Monitoring (BLOOD PRESSURE KIT) DEVI 1 Device by Does not apply route as needed.  . [DISCONTINUED] ibuprofen (ADVIL,MOTRIN) 600 MG tablet Take 1 tablet (600 mg total) by mouth every 6 (six) hours as needed for moderate pain or cramping. (Patient not taking: Reported on 05/31/2017)   No facility-administered encounter medications on file as of 11/25/2020.    Follow-up: Return if symptoms worsen or fail to improve.   Kerin Perna, NP

## 2020-12-25 ENCOUNTER — Ambulatory Visit (INDEPENDENT_AMBULATORY_CARE_PROVIDER_SITE_OTHER): Payer: BC Managed Care – PPO | Admitting: Obstetrics and Gynecology

## 2020-12-25 ENCOUNTER — Other Ambulatory Visit: Payer: Self-pay

## 2020-12-25 ENCOUNTER — Encounter: Payer: Self-pay | Admitting: Obstetrics and Gynecology

## 2020-12-25 VITALS — BP 106/76 | HR 105 | Wt 176.0 lb

## 2020-12-25 DIAGNOSIS — Z789 Other specified health status: Secondary | ICD-10-CM

## 2020-12-25 DIAGNOSIS — Z8759 Personal history of other complications of pregnancy, childbirth and the puerperium: Secondary | ICD-10-CM

## 2020-12-25 DIAGNOSIS — O09899 Supervision of other high risk pregnancies, unspecified trimester: Secondary | ICD-10-CM | POA: Insufficient documentation

## 2020-12-25 DIAGNOSIS — O0990 Supervision of high risk pregnancy, unspecified, unspecified trimester: Secondary | ICD-10-CM | POA: Diagnosis not present

## 2020-12-25 MED ORDER — ASPIRIN EC 81 MG PO TBEC: 81.0000 mg | DELAYED_RELEASE_TABLET | Freq: Every day | ORAL | 2 refills | Status: DC

## 2020-12-25 MED ORDER — BLOOD PRESSURE KIT DEVI: 1.0000 | 0 refills | Status: AC | PRN

## 2020-12-25 NOTE — Progress Notes (Signed)
   PRENATAL VISIT NOTE  Subjective:  [name] [name] is a 35 y.o. [id number] at [redacted]w[redacted]d being seen today for ongoing prenatal care.  She is currently monitored for the following issues for this high-risk pregnancy and has History of stillbirth at 24 weeks; Supervision of high risk pregnancy, antepartum; Language barrier; and History of preterm delivery, currently pregnant on their problem list.  Patient reports no complaints.  Contractions: Not present. Vag. Bleeding: None.  Movement: Absent. Denies leaking of fluid.   The following portions of the patient's history were reviewed and updated as appropriate: allergies, current medications, past family history, past medical history, past social history, past surgical history and problem list.   Objective:   Vitals:   12/25/20 1321  BP: 106/76  Pulse: (!) 105  Weight: 176 lb (79.8 kg)    Fetal Status: Fetal Heart Rate (bpm): 155   Movement: Absent     General:  Alert, oriented and cooperative. Patient is in no acute distress.  Skin: Skin is warm and dry. No rash noted.   Cardiovascular: Normal heart rate noted  Respiratory: Normal respiratory effort, no problems with respiration noted  Abdomen: Soft, gravid, appropriate for gestational age.  Pain/Pressure: Present     Pelvic: Cervical exam deferred        Extremities: Normal range of motion.  Edema: None  Mental Status: Normal mood and affect. Normal behavior. Normal judgment and thought content.   Assessment and Plan:  Pregnancy: [id number] at [redacted]w[redacted]d 1. Supervision of high risk pregnancy, antepartum Amenable to AFP today. Has anatomy u/s already scheduled  2. Language barrier Interpreter used  3. History of preterm delivery, currently pregnant On 17p in last pregnancy; declines this pregnancy. Had 31wk G1 spontaneous PTB in [place] and on 17p for last two term SVDs. Patient amenable to get a cervical length before her anatomy u/s.  - [place] MFM OB Transvaginal; Future  4. History of stillbirth at 24  weeks Serial growth u/s every month and qwk testing at 32wks and recommend delivery at 39wks.  Patient amenable to starting a low dose ASA today.   Preterm labor symptoms and general obstetric precautions including but not limited to vaginal bleeding, contractions, leaking of fluid and fetal movement were reviewed in detail with the patient. Please refer to After Visit Summary for other counseling recommendations.   Return in about 19 days (around 01/13/2021) for in person, md or app, high risk.  Future Appointments  Date Time Provider Department Center  12/29/2020 10:30 AM [facility]URSE [facility]  12/29/2020 10:45 AM [facility] US6 [facility]  01/13/2021 10:00 AM [facility] NURSE [facility]  01/13/2021 10:15 AM [facility] US2 [facility]  01/14/2021  1:15 PM Reva Bores, MD [facility]    Nyack Bing, MD

## 2020-12-27 LAB — AFP, SERUM, OPEN SPINA BIFIDA
AFP MoM: 1.94
AFP Value: 62.8 ng/mL
Gest. Age on Collection Date: 16.2 weeks
Maternal Age At EDD: 32.9 yr
OSBR Risk 1 IN: 922
Test Results:: NEGATIVE
Weight: 176 [lb_av]

## 2020-12-27 LAB — TSH: TSH: 2.23 u[IU]/mL (ref 0.450–4.500)

## 2020-12-29 ENCOUNTER — Other Ambulatory Visit: Payer: Self-pay | Admitting: Obstetrics and Gynecology

## 2020-12-29 ENCOUNTER — Other Ambulatory Visit: Payer: Self-pay

## 2020-12-29 ENCOUNTER — Ambulatory Visit: Payer: BC Managed Care – PPO | Attending: Obstetrics and Gynecology

## 2020-12-29 ENCOUNTER — Encounter: Payer: Self-pay | Admitting: *Deleted

## 2020-12-29 ENCOUNTER — Ambulatory Visit: Payer: BC Managed Care – PPO | Admitting: *Deleted

## 2020-12-29 DIAGNOSIS — Z8759 Personal history of other complications of pregnancy, childbirth and the puerperium: Secondary | ICD-10-CM

## 2020-12-29 DIAGNOSIS — O0990 Supervision of high risk pregnancy, unspecified, unspecified trimester: Secondary | ICD-10-CM

## 2020-12-29 DIAGNOSIS — Z3687 Encounter for antenatal screening for uncertain dates: Secondary | ICD-10-CM

## 2020-12-29 DIAGNOSIS — O09212 Supervision of pregnancy with history of pre-term labor, second trimester: Secondary | ICD-10-CM

## 2020-12-29 DIAGNOSIS — O09899 Supervision of other high risk pregnancies, unspecified trimester: Secondary | ICD-10-CM | POA: Diagnosis not present

## 2021-01-13 ENCOUNTER — Ambulatory Visit: Payer: BC Managed Care – PPO

## 2021-01-13 ENCOUNTER — Other Ambulatory Visit: Payer: BC Managed Care – PPO

## 2021-01-14 ENCOUNTER — Other Ambulatory Visit: Payer: Self-pay

## 2021-01-14 ENCOUNTER — Ambulatory Visit (INDEPENDENT_AMBULATORY_CARE_PROVIDER_SITE_OTHER): Payer: BC Managed Care – PPO | Admitting: Family Medicine

## 2021-01-14 VITALS — BP 106/72 | HR 97 | Wt 180.3 lb

## 2021-01-14 DIAGNOSIS — Z8759 Personal history of other complications of pregnancy, childbirth and the puerperium: Secondary | ICD-10-CM

## 2021-01-14 DIAGNOSIS — O09899 Supervision of other high risk pregnancies, unspecified trimester: Secondary | ICD-10-CM

## 2021-01-14 DIAGNOSIS — O0990 Supervision of high risk pregnancy, unspecified, unspecified trimester: Secondary | ICD-10-CM

## 2021-01-14 NOTE — Patient Instructions (Signed)

## 2021-01-14 NOTE — Progress Notes (Signed)
   PRENATAL VISIT NOTE  Subjective:  [name] [name] is a 35 y.o. O2V0350 at [redacted]w[redacted]d being seen today for ongoing prenatal care.  She is currently monitored for the following issues for this high-risk pregnancy and has History of stillbirth at 24 weeks; Supervision of high risk pregnancy, antepartum; Language barrier; and History of preterm delivery, currently pregnant on their problem list.  Patient reports no complaints.  Contractions: Not present. Vag. Bleeding: None.  Movement: Present. Denies leaking of fluid. Denies any nausea or vomiting.   The following portions of the patient's history were reviewed and updated as appropriate: allergies, current medications, past family history, past medical history, past social history, past surgical history and problem list.   Patient did not require any interpreter services during this encounter.   Objective:   Vitals:   01/14/21 1321  BP: 106/72  Pulse: 97  Weight: 180 lb 4.8 oz (81.8 kg)    Fetal Status: Fetal Heart Rate (bpm): 154 Fundal Height: 20 cm Movement: Present     General:  Alert, oriented and cooperative. Patient is in no acute distress.  Skin: Skin is warm and dry. No rash noted.   Cardiovascular: Normal heart rate noted  Respiratory: Normal respiratory effort, no problems with respiration noted  Abdomen: Soft, gravid, appropriate for gestational age.  Pain/Pressure: Absent.   Pelvic: Cervical exam deferred        Extremities: Normal range of motion.  Edema: None  Mental Status: Normal mood and affect. Normal behavior. Normal judgment and thought content.   Assessment and Plan:  Pregnancy: [id number] at [redacted]w[redacted]d  1. History of preterm delivery, currently pregnant Patient received 17p in her last pregnancy. She declines 17p this pregnancy. Patient has a history of 31wk G1 spontaneous PTB in [place] and on 17p for her last two term SVDs. Shared decision making and she declines 17 P this pregnancy  2. Supervision of high risk pregnancy,  antepartum Patient's U/S on 5/23 demonstrated normal fetal anatomy and cervix measurement was within normal limits.   3. History of stillbirth at 24 weeks Patient will continue to have serial growth ultrasounds and qwk testing at 32 weeks. Delivery recommendation at 39 weeks.  Patient continues to take ASA as prescribed.   General obstetric precautions including but not limited to vaginal bleeding, contractions, leaking of fluid and fetal movement were reviewed in detail with the patient. Please refer to After Visit Summary for other counseling recommendations.   Return in 4 weeks (on 02/11/2021).  Future Appointments  Date Time Provider Department Center  01/27/2021 10:30 AM [facility] NURSE [facility]  01/27/2021 10:45 AM [facility] US4 [facility]  02/12/2021  9:55 AM Reva Bores, MD [facility]    Edmon Crape, Student-PA  01/14/2021 2:24 PM

## 2021-01-27 ENCOUNTER — Encounter: Payer: Self-pay | Admitting: *Deleted

## 2021-01-27 ENCOUNTER — Ambulatory Visit: Payer: BC Managed Care – PPO | Attending: Obstetrics and Gynecology

## 2021-01-27 ENCOUNTER — Other Ambulatory Visit: Payer: Self-pay | Admitting: *Deleted

## 2021-01-27 ENCOUNTER — Other Ambulatory Visit: Payer: Self-pay

## 2021-01-27 ENCOUNTER — Ambulatory Visit: Payer: BC Managed Care – PPO | Admitting: *Deleted

## 2021-01-27 VITALS — BP 101/67 | HR 93

## 2021-01-27 DIAGNOSIS — Z8759 Personal history of other complications of pregnancy, childbirth and the puerperium: Secondary | ICD-10-CM

## 2021-01-27 DIAGNOSIS — O09292 Supervision of pregnancy with other poor reproductive or obstetric history, second trimester: Secondary | ICD-10-CM

## 2021-01-27 DIAGNOSIS — Z3687 Encounter for antenatal screening for uncertain dates: Secondary | ICD-10-CM | POA: Insufficient documentation

## 2021-01-27 DIAGNOSIS — O99212 Obesity complicating pregnancy, second trimester: Secondary | ICD-10-CM

## 2021-01-27 DIAGNOSIS — O09212 Supervision of pregnancy with history of pre-term labor, second trimester: Secondary | ICD-10-CM | POA: Diagnosis not present

## 2021-01-27 DIAGNOSIS — E669 Obesity, unspecified: Secondary | ICD-10-CM | POA: Diagnosis not present

## 2021-01-27 DIAGNOSIS — O0990 Supervision of high risk pregnancy, unspecified, unspecified trimester: Secondary | ICD-10-CM | POA: Diagnosis not present

## 2021-01-27 DIAGNOSIS — Z3A21 21 weeks gestation of pregnancy: Secondary | ICD-10-CM | POA: Diagnosis not present

## 2021-01-27 DIAGNOSIS — Z362 Encounter for other antenatal screening follow-up: Secondary | ICD-10-CM | POA: Diagnosis not present

## 2021-02-12 ENCOUNTER — Other Ambulatory Visit: Payer: Self-pay

## 2021-02-12 ENCOUNTER — Ambulatory Visit (INDEPENDENT_AMBULATORY_CARE_PROVIDER_SITE_OTHER): Payer: BC Managed Care – PPO | Admitting: Family Medicine

## 2021-02-12 VITALS — BP 119/81 | HR 93 | Wt 182.0 lb

## 2021-02-12 DIAGNOSIS — O09899 Supervision of other high risk pregnancies, unspecified trimester: Secondary | ICD-10-CM

## 2021-02-12 DIAGNOSIS — O0990 Supervision of high risk pregnancy, unspecified, unspecified trimester: Secondary | ICD-10-CM

## 2021-02-12 NOTE — Progress Notes (Signed)
   PRENATAL VISIT NOTE  Subjective:  [name] [name] is a 35 y.o. G0F7494 at [redacted]w[redacted]d being seen today for ongoing prenatal care.  She is currently monitored for the following issues for this high-risk pregnancy and has History of stillbirth at 24 weeks; Supervision of high risk pregnancy, antepartum; Language barrier; and History of preterm delivery, currently pregnant on their problem list.  Patient reports no complaints.  Contractions: Not present. Vag. Bleeding: None.  Movement: Present. Denies leaking of fluid.   The following portions of the patient's history were reviewed and updated as appropriate: allergies, current medications, past family history, past medical history, past social history, past surgical history and problem list.   Objective:   Vitals:   02/12/21 1026  BP: 119/81  Pulse: 93  Weight: 182 lb (82.6 kg)    Fetal Status: Fetal Heart Rate (bpm): 158 Fundal Height: 25 cm Movement: Present     General:  Alert, oriented and cooperative. Patient is in no acute distress.  Skin: Skin is warm and dry. No rash noted.   Cardiovascular: Normal heart rate noted  Respiratory: Normal respiratory effort, no problems with respiration noted  Abdomen: Soft, gravid, appropriate for gestational age.  Pain/Pressure: Absent     Pelvic: Cervical exam deferred        Extremities: Normal range of motion.  Edema: None  Mental Status: Normal mood and affect. Normal behavior. Normal judgment and thought content.   Assessment and Plan:  Pregnancy: [id number] at [redacted]w[redacted]d 1. Supervision of high risk pregnancy, antepartum Continue routine prenatal care. She is unclear why she is checking her CBGs daily--I can not find evidence of why--no h/o GDM, normal HgbA1C in early pregnancy--she may stop checking these.  2. History of preterm delivery, currently pregnant Declines Makena  Preterm labor symptoms and general obstetric precautions including but not limited to vaginal bleeding, contractions, leaking of  fluid and fetal movement were reviewed in detail with the patient. Please refer to After Visit Summary for other counseling recommendations.   Return in about 4 weeks (around 03/12/2021) for 28 wk labs, [facility], needs MD.  Future Appointments  Date Time Provider Department Center  02/25/2021  9:00 AM [facility] NURSE [facility]  02/25/2021  9:15 AM [facility] US2 [facility]  03/16/2021  8:20 AM [facility]  03/16/2021  9:35 AM Kooistra, Charlesetta Garibaldi, CNM [facility]    Reva Bores, MD

## 2021-02-25 ENCOUNTER — Encounter: Payer: Self-pay | Admitting: *Deleted

## 2021-02-25 ENCOUNTER — Other Ambulatory Visit: Payer: Self-pay

## 2021-02-25 ENCOUNTER — Ambulatory Visit: Payer: BC Managed Care – PPO | Attending: Obstetrics

## 2021-02-25 ENCOUNTER — Ambulatory Visit: Payer: BC Managed Care – PPO | Admitting: *Deleted

## 2021-02-25 ENCOUNTER — Other Ambulatory Visit: Payer: Self-pay | Admitting: *Deleted

## 2021-02-25 VITALS — BP 109/69 | HR 92

## 2021-02-25 DIAGNOSIS — O09292 Supervision of pregnancy with other poor reproductive or obstetric history, second trimester: Secondary | ICD-10-CM

## 2021-02-25 DIAGNOSIS — E669 Obesity, unspecified: Secondary | ICD-10-CM | POA: Diagnosis not present

## 2021-02-25 DIAGNOSIS — Z3A26 26 weeks gestation of pregnancy: Secondary | ICD-10-CM | POA: Diagnosis not present

## 2021-02-25 DIAGNOSIS — Z8759 Personal history of other complications of pregnancy, childbirth and the puerperium: Secondary | ICD-10-CM

## 2021-02-25 DIAGNOSIS — O0990 Supervision of high risk pregnancy, unspecified, unspecified trimester: Secondary | ICD-10-CM | POA: Diagnosis not present

## 2021-02-25 DIAGNOSIS — O99212 Obesity complicating pregnancy, second trimester: Secondary | ICD-10-CM | POA: Diagnosis not present

## 2021-02-25 DIAGNOSIS — O09899 Supervision of other high risk pregnancies, unspecified trimester: Secondary | ICD-10-CM

## 2021-02-25 DIAGNOSIS — Z362 Encounter for other antenatal screening follow-up: Secondary | ICD-10-CM | POA: Diagnosis not present

## 2021-02-25 DIAGNOSIS — O09212 Supervision of pregnancy with history of pre-term labor, second trimester: Secondary | ICD-10-CM | POA: Diagnosis not present

## 2021-02-25 IMAGING — US US MFM OB FOLLOW-UP
1 series · 14 of 28 positions shown · non-contrast
Comparison: none

[Series 1: us mfm ob follow-up · 52 acquisitions, 14 frames shown]
[im 2/52]
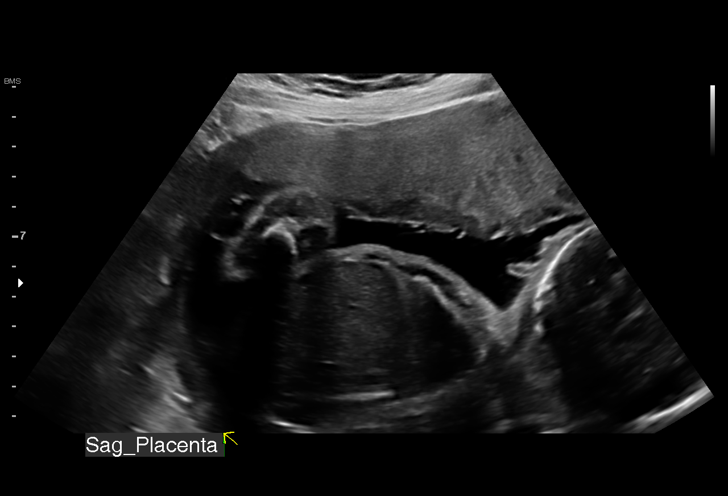
[im 6/52]
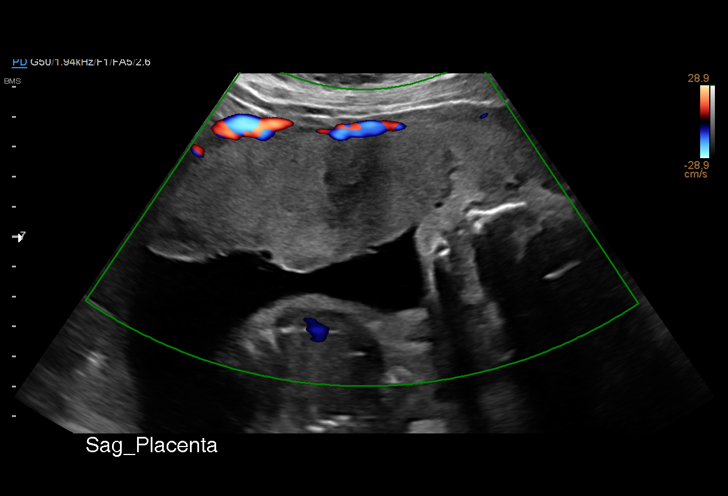
[im 10/52]
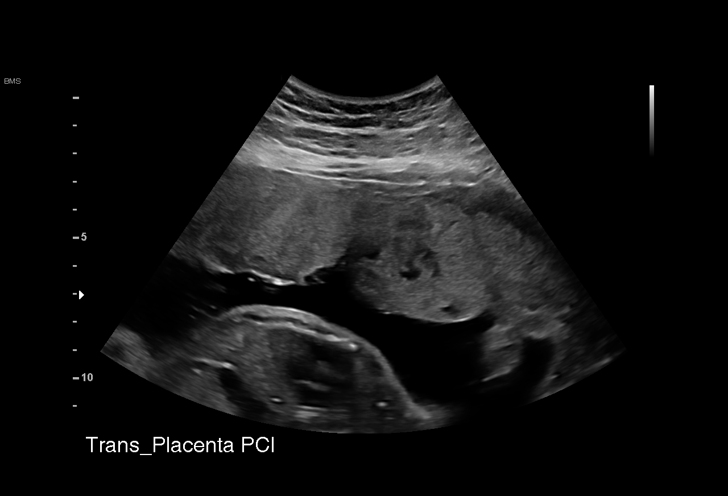
[im 14/52]
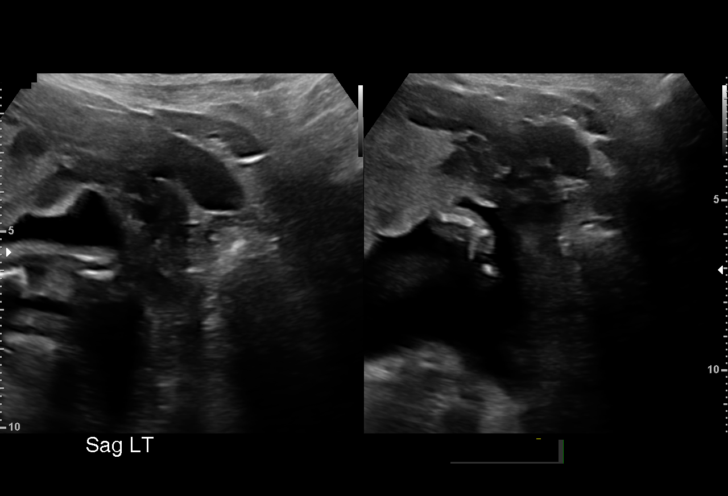
[im 18/52]
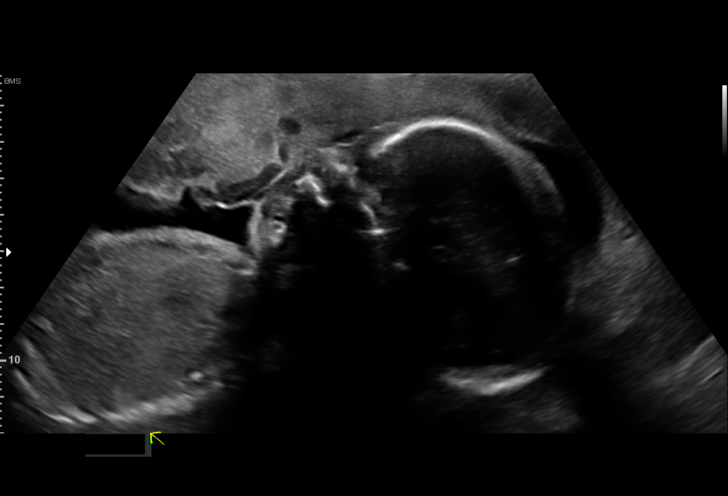
[im 21/52]
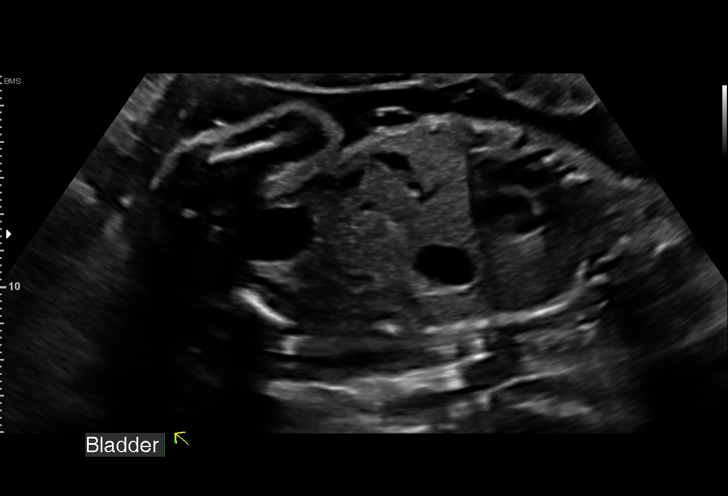
[im 25/52]
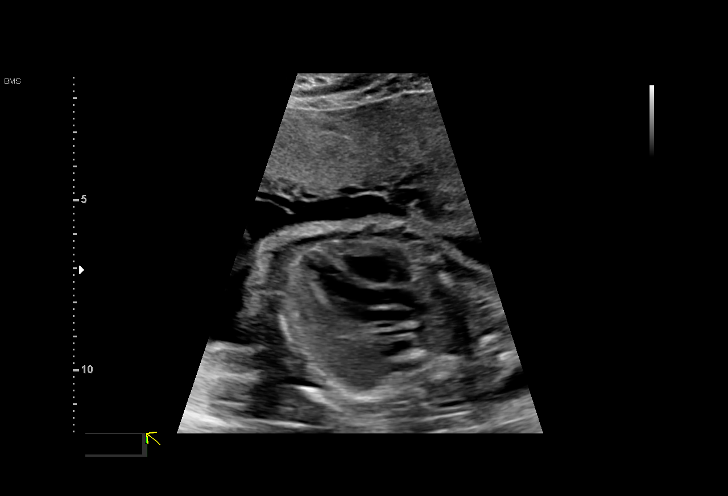
[im 29/52]
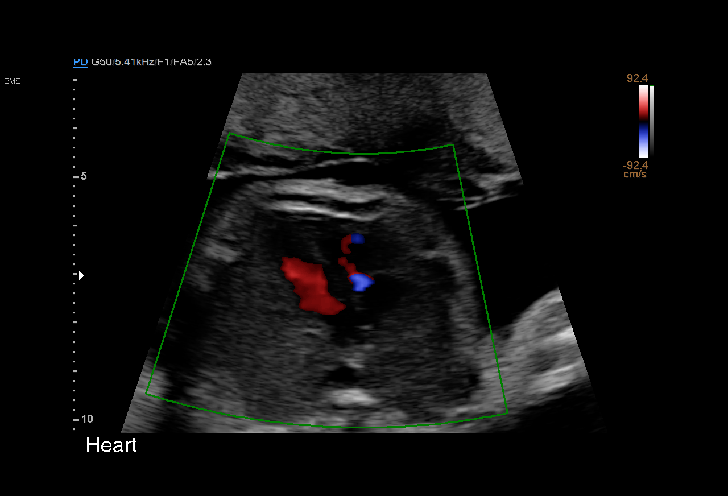
[im 33/52]
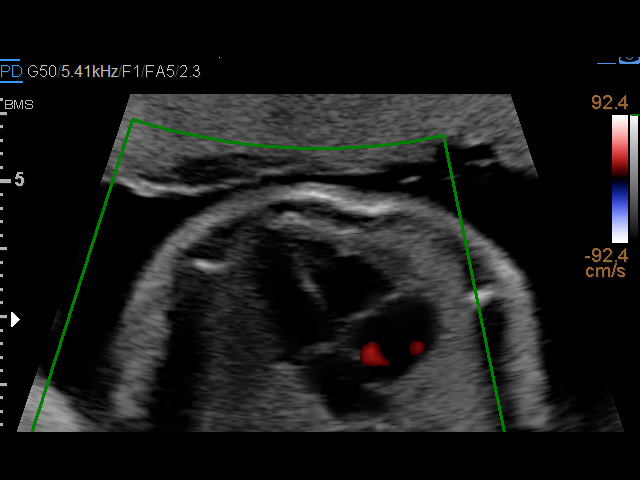
[im 36/52]
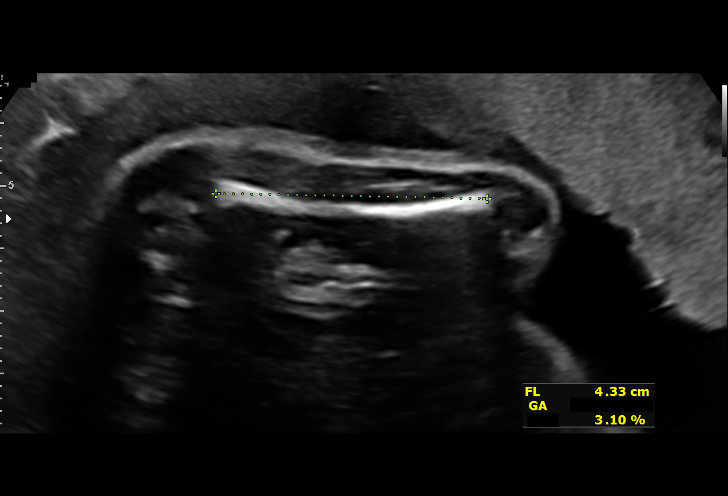
[im 40/52]
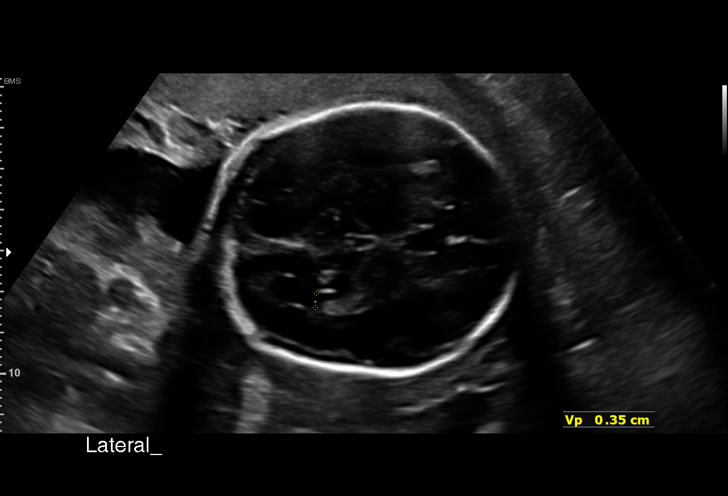
[im 44/52]
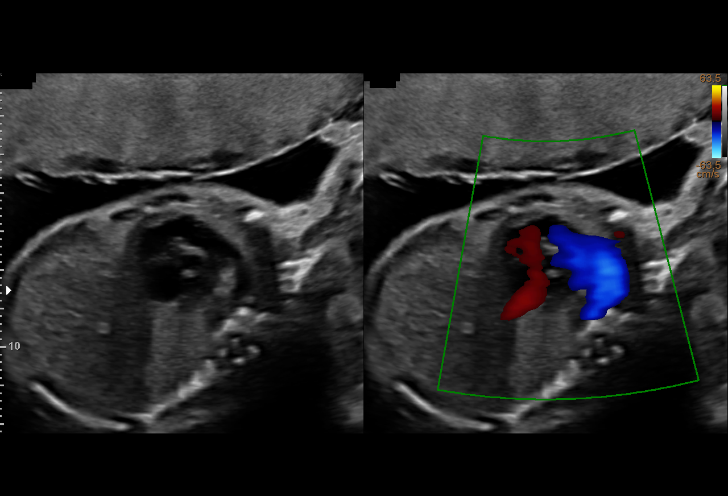
[im 48/52]
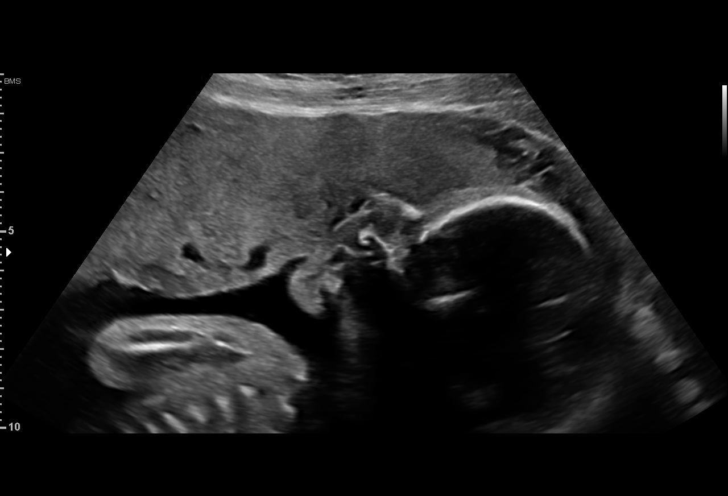
[im 52/52]
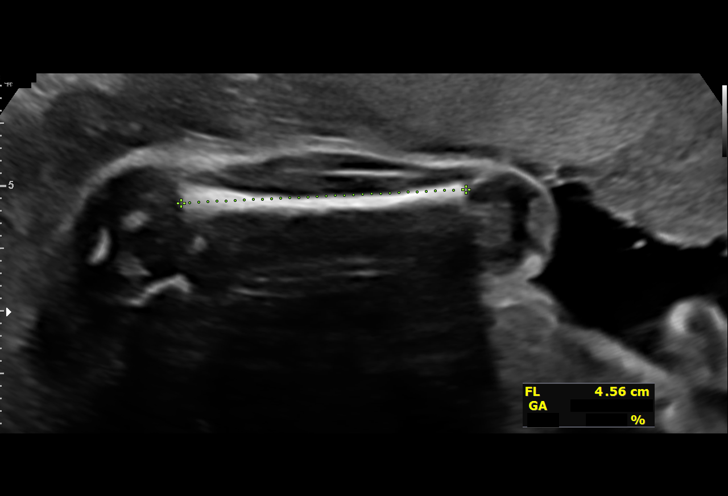

[14 of 28 positions shown; findings below may reference images not displayed]

Indications

 Poor obstetric history: Previous IUFD          [P7]
 (stillbirth at 24 weeks)
 Obesity complicating pregnancy, second         [P7]
 trimester (BMI 30)
 History of pre-term deliveries                 [P7]
 LR NIPS/ Negative Horizon/ AFP Negative
 Antenatal follow-up for nonvisualized fetal    [P7]
 anatomy
 26 weeks gestation of pregnancy
Fetal Evaluation

 Num Of Fetuses:         1
 Fetal Heart Rate(bpm):  147
 Cardiac Activity:       Observed
 Presentation:           Cephalic
 Placenta:               Anterior
 P. Cord Insertion:      Visualized, central

 Amniotic Fluid
 AFI FV:      Within normal limits

                             Largest Pocket(cm)

Biometry
 BPD:      65.5  mm     G. Age:  26w 3d         56  %    CI:        76.76   %    70 - 86
                                                         FL/HC:      18.9   %    18.6 -
 HC:      236.8  mm     G. Age:  25w 5d         18  %    HC/AC:      1.15        1.04 -
 AC:      205.2  mm     G. Age:  25w 1d         17  %    FL/BPD:     68.4   %    71 - 87
 FL:       44.8  mm     G. Age:  24w 5d          8  %    FL/AC:      21.8   %    20 - 24
 HUM:      43.7  mm     G. Age:  26w 0d         47  %
 LV:        3.5  mm

 Est. FW:     772  gm    1 lb 11 oz      11  %
OB History

 Gravidity:    5         Term:   2        Prem:   2
 Living:       3
Gestational Age

 LMP:           25w 1d        Date:  [DATE]                 EDD:   [DATE]
 U/S Today:     25w 4d                                        EDD:   [DATE]
 Best:          26w 0d     Det. By:  U/S  ([DATE])          EDD:   [DATE]
Anatomy

 Cranium:               Appears normal         LVOT:                   Appears normal
 Cavum:                 Appears normal         Aortic Arch:            Previously seen
 Ventricles:            Appears normal         Ductal Arch:            Previously seen
 Choroid Plexus:        Previously seen        Diaphragm:              Appears normal
 Cerebellum:            Previously seen        Stomach:                Appears normal, left
                                                                       sided
 Posterior Fossa:       Previously seen        Abdomen:                Appears normal
 Nuchal Fold:           Previously seen        Abdominal Wall:         Previously seen
 Face:                  Profile nl; orbits     Cord Vessels:           Previously seen
                        prev seen
 Lips:                  Appears normal         Kidneys:                Previously seen
 Palate:                Not well visualized    Bladder:                Appears normal
 Thoracic:              Previously seen        Spine:                  Previously seen
 Heart:                 Appears normal         Upper Extremities:      Previously seen
                        (4CH, axis, and
                        situs)
 RVOT:                  Appears normal         Lower Extremities:      Previously seen
Cervix Uterus Adnexa

 Cervix
 Not visualized (advanced GA >[P7])

 Uterus
 No abnormality visualized.

 Right Ovary
 Within normal limits.

 Left Ovary
 Not visualized.
 Cul De Sac
 No free fluid seen.

 Adnexa
 No adnexal mass visualized.
Impression

 History of stillbirth.

 Fetal growth is appropriate for gestational age .the estimated
 fetal weight is at the 11th percentile.  Amniotic fluid is normal
 and good fetal activity is seen .
Recommendations

 -An appointment was made for her to return in 4 weeks for
 fetal growth assessment.
                 VAR PASI

## 2021-03-16 ENCOUNTER — Other Ambulatory Visit: Payer: Self-pay | Admitting: General Practice

## 2021-03-16 ENCOUNTER — Other Ambulatory Visit: Payer: Self-pay

## 2021-03-16 ENCOUNTER — Other Ambulatory Visit: Payer: BC Managed Care – PPO

## 2021-03-16 ENCOUNTER — Ambulatory Visit (INDEPENDENT_AMBULATORY_CARE_PROVIDER_SITE_OTHER): Payer: BC Managed Care – PPO | Admitting: Student

## 2021-03-16 VITALS — BP 111/78 | HR 101 | Wt 186.3 lb

## 2021-03-16 DIAGNOSIS — R300 Dysuria: Secondary | ICD-10-CM

## 2021-03-16 DIAGNOSIS — Z3A28 28 weeks gestation of pregnancy: Secondary | ICD-10-CM

## 2021-03-16 DIAGNOSIS — O0990 Supervision of high risk pregnancy, unspecified, unspecified trimester: Secondary | ICD-10-CM

## 2021-03-16 DIAGNOSIS — O09899 Supervision of other high risk pregnancies, unspecified trimester: Secondary | ICD-10-CM

## 2021-03-16 DIAGNOSIS — O26893 Other specified pregnancy related conditions, third trimester: Secondary | ICD-10-CM | POA: Diagnosis not present

## 2021-03-16 DIAGNOSIS — Z23 Encounter for immunization: Secondary | ICD-10-CM | POA: Diagnosis not present

## 2021-03-16 DIAGNOSIS — Z8759 Personal history of other complications of pregnancy, childbirth and the puerperium: Secondary | ICD-10-CM

## 2021-03-16 LAB — POCT URINALYSIS DIP (DEVICE)
Bilirubin Urine: NEGATIVE
Glucose, UA: NEGATIVE mg/dL
Ketones, ur: NEGATIVE mg/dL
Leukocytes,Ua: NEGATIVE
Nitrite: NEGATIVE
Protein, ur: NEGATIVE mg/dL
Specific Gravity, Urine: 1.025 (ref 1.005–1.030)
Urobilinogen, UA: 0.2 mg/dL (ref 0.0–1.0)
pH: 6 (ref 5.0–8.0)

## 2021-03-16 NOTE — Progress Notes (Signed)
Pt states having burning with urination x 2 weeks. Will check UA today in office.

## 2021-03-16 NOTE — Progress Notes (Signed)
Subjective:  [name] [name] is a 35 y.o. P7T0626 at [redacted]w[redacted]d being seen today for prenatal care.  Patient reports  dysuria and frequency for the past 2 weeks. She notices it more at night. She has not had associated back pain or abdominal pain  She denies fever.  Contractions: Not present.  Vag. Bleeding: None. Movement: Present. Denies leaking of fluid.   The following portions of the patient's history were reviewed and updated as appropriate: allergies, current medications, past family history, past medical history, past social history, past surgical history and problem list.   Objective:   Vitals:   03/16/21 0913  BP: 111/78  Pulse: (!) 101  Weight: 186 lb 4.8 oz (84.5 kg)    Fetal Status: Fetal Heart Rate (bpm): 146 Fundal Height: 28 cm Movement: Present     General:  Alert, oriented and cooperative. Patient is in no acute distress.  Skin: Skin is warm and dry. No rash noted.   Cardiovascular: Normal heart rate noted  Respiratory: Normal respiratory effort, no problems with respiration noted  Abdomen: Soft, gravid, appropriate for gestational age. Pain/Pressure: Present     Vaginal: Vag. Bleeding: None.       Cervix: Not evaluated        Extremities: Normal range of motion.  Edema: None  Mental Status: Normal mood and affect. Normal behavior. Normal judgment and thought content.   Urinalysis:      Assessment and Plan:  Pregnancy: [id number] at [redacted]w[redacted]d  1. Supervision of high risk pregnancy, antepartum - Denies any contractions, LOF, abnormal discharge or bleeding, affirms FM   2. [redacted] weeks gestation of pregnancy  - 2hr GTT  - CBC - HIV Antibody  - RPR - TDaP   3. Dysuria in pregnancy, third trimester  - UA collected in office today   4. History of preterm delivery, currently pregnant - Denies 17-P   5. History of IUFD at 66 weeks  - Monthly [place] with MFM, next scheduled for 8/17   Preterm labor symptoms and general obstetric precautions including but not limited to vaginal  bleeding, contractions, leaking of fluid and fetal movement were reviewed in detail with the patient. Please refer to After Visit Summary for other counseling recommendations.  No follow-ups on file.   Athena Masse, Student-PA

## 2021-03-17 LAB — RPR: RPR Ser Ql: NONREACTIVE

## 2021-03-17 LAB — CBC
Hematocrit: 36.8 % (ref 34.0–46.6)
Hemoglobin: 12.2 g/dL (ref 11.1–15.9)
MCH: 29.6 pg (ref 26.6–33.0)
MCHC: 33.2 g/dL (ref 31.5–35.7)
MCV: 89 fL (ref 79–97)
Platelets: 238 10*3/uL (ref 150–450)
RBC: 4.12 x10E6/uL (ref 3.77–5.28)
RDW: 12.8 % (ref 11.7–15.4)
WBC: 8.9 10*3/uL (ref 3.4–10.8)

## 2021-03-17 LAB — HIV ANTIBODY (ROUTINE TESTING W REFLEX): HIV Screen 4th Generation wRfx: NONREACTIVE

## 2021-03-18 LAB — URINE CULTURE, OB REFLEX

## 2021-03-18 LAB — CULTURE, OB URINE

## 2021-03-25 ENCOUNTER — Ambulatory Visit: Payer: BC Managed Care – PPO | Attending: Obstetrics and Gynecology

## 2021-03-25 ENCOUNTER — Encounter: Payer: Self-pay | Admitting: *Deleted

## 2021-03-25 ENCOUNTER — Other Ambulatory Visit: Payer: Self-pay

## 2021-03-25 ENCOUNTER — Other Ambulatory Visit: Payer: Self-pay | Admitting: *Deleted

## 2021-03-25 ENCOUNTER — Ambulatory Visit: Payer: BC Managed Care – PPO | Admitting: *Deleted

## 2021-03-25 VITALS — BP 102/68 | HR 98

## 2021-03-25 DIAGNOSIS — O322XX0 Maternal care for transverse and oblique lie, not applicable or unspecified: Secondary | ICD-10-CM | POA: Diagnosis not present

## 2021-03-25 DIAGNOSIS — O99213 Obesity complicating pregnancy, third trimester: Secondary | ICD-10-CM

## 2021-03-25 DIAGNOSIS — O09899 Supervision of other high risk pregnancies, unspecified trimester: Secondary | ICD-10-CM

## 2021-03-25 DIAGNOSIS — O0990 Supervision of high risk pregnancy, unspecified, unspecified trimester: Secondary | ICD-10-CM

## 2021-03-25 DIAGNOSIS — O09213 Supervision of pregnancy with history of pre-term labor, third trimester: Secondary | ICD-10-CM | POA: Diagnosis not present

## 2021-03-25 DIAGNOSIS — E669 Obesity, unspecified: Secondary | ICD-10-CM

## 2021-03-25 DIAGNOSIS — R638 Other symptoms and signs concerning food and fluid intake: Secondary | ICD-10-CM

## 2021-03-25 DIAGNOSIS — O09293 Supervision of pregnancy with other poor reproductive or obstetric history, third trimester: Secondary | ICD-10-CM

## 2021-03-25 DIAGNOSIS — Z3A30 30 weeks gestation of pregnancy: Secondary | ICD-10-CM | POA: Diagnosis not present

## 2021-03-25 DIAGNOSIS — Z8759 Personal history of other complications of pregnancy, childbirth and the puerperium: Secondary | ICD-10-CM

## 2021-04-01 ENCOUNTER — Ambulatory Visit (INDEPENDENT_AMBULATORY_CARE_PROVIDER_SITE_OTHER): Payer: BC Managed Care – PPO | Admitting: Family Medicine

## 2021-04-01 ENCOUNTER — Other Ambulatory Visit: Payer: BC Managed Care – PPO

## 2021-04-01 ENCOUNTER — Telehealth: Payer: Self-pay | Admitting: *Deleted

## 2021-04-01 ENCOUNTER — Other Ambulatory Visit: Payer: Self-pay

## 2021-04-01 VITALS — BP 117/75 | HR 95 | Wt 189.8 lb

## 2021-04-01 DIAGNOSIS — Z8759 Personal history of other complications of pregnancy, childbirth and the puerperium: Secondary | ICD-10-CM

## 2021-04-01 DIAGNOSIS — O0990 Supervision of high risk pregnancy, unspecified, unspecified trimester: Secondary | ICD-10-CM

## 2021-04-01 DIAGNOSIS — O09899 Supervision of other high risk pregnancies, unspecified trimester: Secondary | ICD-10-CM

## 2021-04-01 DIAGNOSIS — Z3A28 28 weeks gestation of pregnancy: Secondary | ICD-10-CM

## 2021-04-01 DIAGNOSIS — Z789 Other specified health status: Secondary | ICD-10-CM

## 2021-04-01 NOTE — Patient Instructions (Signed)

## 2021-04-01 NOTE — Progress Notes (Signed)
Digital [place] Interpreter- [phone] [name] [name]

## 2021-04-01 NOTE — Telephone Encounter (Signed)
Asked by Dr. Shawnie Pons to call Avelina Laine for patient re: Avelina Laine bill for [phone]. Patient has insurance. I called Thressa Sheller from [employer] and gave patient insurance information and she states it may be that it was denied by [facility] and Medicaid hasn't responded yet; but she will take care of it. Rainelle Sulewski,RN

## 2021-04-01 NOTE — Progress Notes (Signed)
   PRENATAL VISIT NOTE  Subjective:  [name] [name] is a 35 y.o. [id number] at [redacted]w[redacted]d being seen today for ongoing prenatal care.  She is currently monitored for the following issues for this high-risk pregnancy and has History of stillbirth at 24 weeks; Supervision of high risk pregnancy, antepartum; Language barrier; and History of preterm delivery, currently pregnant on their problem list.  Patient reports no bleeding, no contractions, and no leaking.  Contractions: Irritability. Vag. Bleeding: None.  Movement: Present. Denies leaking of fluid.   The following portions of the patient's history were reviewed and updated as appropriate: allergies, current medications, past family history, past medical history, past social history, past surgical history and problem list.   Objective:   Vitals:   04/01/21 0947  BP: 117/75  Pulse: 95  Weight: 189 lb 12.8 oz (86.1 kg)    Fetal Status: Fetal Heart Rate (bpm): 146   Movement: Present     General:  Alert, oriented and cooperative. Patient is in no acute distress.  Skin: Skin is warm and dry. No rash noted.   Cardiovascular: Normal heart rate noted  Respiratory: Normal respiratory effort, no problems with respiration noted  Abdomen: Soft, gravid, appropriate for gestational age.  Pain/Pressure: Present     Pelvic: Cervical exam deferred        Extremities: Normal range of motion.  Edema: Trace  Mental Status: Normal mood and affect. Normal behavior. Normal judgment and thought content.   Assessment and Plan:  Pregnancy: [id number] at [redacted]w[redacted]d 1. Supervision of high risk pregnancy, antepartum - Denies any contractions, vaginal bleeding or discharge, LOF. Endorses FM - Fundal height measuring at 31w  2. History of IUFD at 26 weeks - [place] with MFM on 8/17 showed normal interval growth with measurements consistent with dates  - Digital Burmese interpreter used throughout  Preterm labor symptoms and general obstetric precautions including but not limited  to vaginal bleeding, contractions, leaking of fluid and fetal movement were reviewed in detail with the patient. Please refer to After Visit Summary for other counseling recommendations.   Return in 2 weeks (on 04/15/2021).  Future Appointments  Date Time Provider Department Center  04/09/2021  2:15 PM [facility] NST [facility]  04/09/2021  3:15 PM [place] Fillers, MD [facility]  04/16/2021  1:15 PM [place] Bing, MD [facility]  04/16/2021  2:15 PM [facility] NST [facility]  04/24/2021  1:15 PM [facility] NURSE [facility]  04/24/2021  1:30 PM [facility] US3 [facility]  04/30/2021 10:15 AM [place] Bing, MD [facility]  04/30/2021 11:15 AM [facility] NST [facility]    Gwyndolyn Kaufman, Medical Student

## 2021-04-02 LAB — GLUCOSE TOLERANCE, 2 HOURS W/ 1HR
Glucose, 1 hour: 87 mg/dL (ref 65–179)
Glucose, 2 hour: 79 mg/dL (ref 65–152)
Glucose, Fasting: 62 mg/dL — ABNORMAL LOW (ref 65–91)

## 2021-04-09 ENCOUNTER — Ambulatory Visit (INDEPENDENT_AMBULATORY_CARE_PROVIDER_SITE_OTHER): Payer: BC Managed Care – PPO

## 2021-04-09 ENCOUNTER — Ambulatory Visit (INDEPENDENT_AMBULATORY_CARE_PROVIDER_SITE_OTHER): Payer: BC Managed Care – PPO | Admitting: Obstetrics and Gynecology

## 2021-04-09 ENCOUNTER — Other Ambulatory Visit: Payer: Self-pay

## 2021-04-09 ENCOUNTER — Ambulatory Visit: Payer: BC Managed Care – PPO | Admitting: *Deleted

## 2021-04-09 VITALS — BP 110/73 | HR 96 | Wt 189.5 lb

## 2021-04-09 DIAGNOSIS — Z8759 Personal history of other complications of pregnancy, childbirth and the puerperium: Secondary | ICD-10-CM

## 2021-04-09 DIAGNOSIS — O09899 Supervision of other high risk pregnancies, unspecified trimester: Secondary | ICD-10-CM

## 2021-04-09 DIAGNOSIS — Z5941 Food insecurity: Secondary | ICD-10-CM

## 2021-04-09 DIAGNOSIS — O0990 Supervision of high risk pregnancy, unspecified, unspecified trimester: Secondary | ICD-10-CM

## 2021-04-09 DIAGNOSIS — Z789 Other specified health status: Secondary | ICD-10-CM

## 2021-04-09 DIAGNOSIS — Z3A32 32 weeks gestation of pregnancy: Secondary | ICD-10-CM | POA: Insufficient documentation

## 2021-04-09 NOTE — Progress Notes (Signed)
   PRENATAL VISIT NOTE  Subjective:  [name] [name] is a 35 y.o. [id number] at [redacted]w[redacted]d being seen today for ongoing prenatal care.  She is currently monitored for the following issues for this high-risk pregnancy and has History of stillbirth at 24 weeks; Supervision of high risk pregnancy, antepartum; Language barrier; History of preterm delivery, currently pregnant; Food insecurity; and [redacted] weeks gestation of pregnancy on their problem list.  Patient doing well with no acute concerns today. She reports no complaints.  Contractions: Irregular. Vag. Bleeding: None.  Movement: Present. Denies leaking of fluid.   The following portions of the patient's history were reviewed and updated as appropriate: allergies, current medications, past family history, past medical history, past social history, past surgical history and problem list. Problem list updated.  Objective:   Vitals:   04/09/21 1438  BP: 110/73  Pulse: 96  Weight: 189 lb 8 oz (86 kg)    Fetal Status: Fetal Heart Rate (bpm): NST Fundal Height: 33 cm Movement: Present     General:  Alert, oriented and cooperative. Patient is in no acute distress.  Skin: Skin is warm and dry. No rash noted.   Cardiovascular: Normal heart rate noted  Respiratory: Normal respiratory effort, no problems with respiration noted  Abdomen: Soft, gravid, appropriate for gestational age.  Pain/Pressure: Present     Pelvic: Cervical exam deferred        Extremities: Normal range of motion.     Mental Status:  Normal mood and affect. Normal behavior. Normal judgment and thought content.   Assessment and Plan:  Pregnancy: [id number] at [redacted]w[redacted]d  1. Supervision of high risk pregnancy, antepartum Continue routine care  2. History of stillbirth at 24 weeks Weekly testing  until delivery  3. Language barrier Video interpreter used  4. [redacted] weeks gestation of pregnancy   5. History of preterm delivery, currently pregnant No s/sx of PTL  Preterm labor symptoms and  general obstetric precautions including but not limited to vaginal bleeding, contractions, leaking of fluid and fetal movement were reviewed in detail with the patient.  Please refer to After Visit Summary for other counseling recommendations.   Return in about 2 weeks (around 04/23/2021) for [facility], NST/BPP as scheduled, in person.   Mariel Aloe, MD Faculty Attending Center for [facility]

## 2021-04-15 NOTE — Progress Notes (Signed)
Pt informed that the ultrasound is considered a limited OB ultrasound and is not intended to be a complete ultrasound exam.  Patient also informed that the ultrasound is not being completed with the intent of assessing for fetal or placental anomalies or any pelvic abnormalities.  Explained that the purpose of today's ultrasound is to assess for  BPP, presentation, and AFI.  Patient acknowledges the purpose of the exam and the limitations of the study.     Chase Caller RN BSN 04/16/21

## 2021-04-16 ENCOUNTER — Other Ambulatory Visit: Payer: Self-pay

## 2021-04-16 ENCOUNTER — Ambulatory Visit (INDEPENDENT_AMBULATORY_CARE_PROVIDER_SITE_OTHER): Payer: BC Managed Care – PPO | Admitting: General Practice

## 2021-04-16 ENCOUNTER — Ambulatory Visit (INDEPENDENT_AMBULATORY_CARE_PROVIDER_SITE_OTHER): Payer: BC Managed Care – PPO | Admitting: Obstetrics and Gynecology

## 2021-04-16 ENCOUNTER — Ambulatory Visit (INDEPENDENT_AMBULATORY_CARE_PROVIDER_SITE_OTHER): Payer: BC Managed Care – PPO

## 2021-04-16 VITALS — BP 108/67 | HR 87 | Wt 192.1 lb

## 2021-04-16 DIAGNOSIS — Z789 Other specified health status: Secondary | ICD-10-CM

## 2021-04-16 DIAGNOSIS — Z3A33 33 weeks gestation of pregnancy: Secondary | ICD-10-CM

## 2021-04-16 DIAGNOSIS — Z8759 Personal history of other complications of pregnancy, childbirth and the puerperium: Secondary | ICD-10-CM

## 2021-04-16 DIAGNOSIS — Z23 Encounter for immunization: Secondary | ICD-10-CM

## 2021-04-16 DIAGNOSIS — O09899 Supervision of other high risk pregnancies, unspecified trimester: Secondary | ICD-10-CM

## 2021-04-16 DIAGNOSIS — O0990 Supervision of high risk pregnancy, unspecified, unspecified trimester: Secondary | ICD-10-CM

## 2021-04-16 NOTE — Progress Notes (Signed)
   PRENATAL VISIT NOTE  Subjective:  [name] [name] is a 35 y.o. [id number] at [redacted]w[redacted]d being seen today for ongoing prenatal care.  She is currently monitored for the following issues for this high-risk pregnancy and has History of stillbirth at 24 weeks; Supervision of high risk pregnancy, antepartum; Language barrier; History of preterm delivery, currently pregnant; and Food insecurity on their problem list.  Patient reports no complaints.  Contractions: Irregular. Vag. Bleeding: None.  Movement: Present. Denies leaking of fluid.   The following portions of the patient's history were reviewed and updated as appropriate: allergies, current medications, past family history, past medical history, past social history, past surgical history and problem list.   Objective:   Vitals:   04/16/21 1324  BP: 108/67  Pulse: 87  Weight: 192 lb 1.6 oz (87.1 kg)    Fetal Status: Fetal Heart Rate (bpm): 147   Movement: Present     General:  Alert, oriented and cooperative. Patient is in no acute distress.  Skin: Skin is warm and dry. No rash noted.   Cardiovascular: Normal heart rate noted  Respiratory: Normal respiratory effort, no problems with respiration noted  Abdomen: Soft, gravid, appropriate for gestational age.  Pain/Pressure: Present     Pelvic: Cervical exam deferred        Extremities: Normal range of motion.  Edema: Trace  Mental Status: Normal mood and affect. Normal behavior. Normal judgment and thought content.   Assessment and Plan:  Pregnancy: [id number] at [redacted]w[redacted]d 1. Supervision of high risk pregnancy, antepartum Routine care.  2. [redacted] weeks gestation of pregnancy  3. History of stillbirth at 24 weeks Has bpp/nst later today Continue with qwk testing until delivery Delivery at 39wks Has rpt growth next week; 8/17: afi 12, 47%, 1545gm, ac 67%  4. Language barrier Interpreter used  5. History of preterm delivery, currently pregnant Not on 17p  Preterm labor symptoms and general  obstetric precautions including but not limited to vaginal bleeding, contractions, leaking of fluid and fetal movement were reviewed in detail with the patient. Please refer to After Visit Summary for other counseling recommendations.   Return in about 2 weeks (around 04/30/2021) for in person, md visit, high risk ob, nst/bpp with diane.  Future Appointments  Date Time Provider Department Center  04/16/2021  2:15 PM [facility] NST [facility]  04/24/2021  1:15 PM [facility] NURSE [facility]  04/24/2021  1:30 PM [facility] US3 [facility]  04/30/2021 10:15 AM [place] Bing, MD [facility]  04/30/2021 11:15 AM [facility] NST [facility]    [place] Bing, MD

## 2021-04-24 ENCOUNTER — Ambulatory Visit: Payer: BC Managed Care – PPO | Attending: Maternal & Fetal Medicine

## 2021-04-24 ENCOUNTER — Ambulatory Visit: Payer: BC Managed Care – PPO | Admitting: *Deleted

## 2021-04-24 ENCOUNTER — Other Ambulatory Visit: Payer: Self-pay

## 2021-04-24 VITALS — BP 101/66 | HR 98

## 2021-04-24 DIAGNOSIS — Z5941 Food insecurity: Secondary | ICD-10-CM

## 2021-04-24 DIAGNOSIS — O0990 Supervision of high risk pregnancy, unspecified, unspecified trimester: Secondary | ICD-10-CM | POA: Insufficient documentation

## 2021-04-24 DIAGNOSIS — Z3A34 34 weeks gestation of pregnancy: Secondary | ICD-10-CM | POA: Diagnosis not present

## 2021-04-24 DIAGNOSIS — O09213 Supervision of pregnancy with history of pre-term labor, third trimester: Secondary | ICD-10-CM

## 2021-04-24 DIAGNOSIS — O09899 Supervision of other high risk pregnancies, unspecified trimester: Secondary | ICD-10-CM | POA: Diagnosis not present

## 2021-04-24 DIAGNOSIS — R638 Other symptoms and signs concerning food and fluid intake: Secondary | ICD-10-CM | POA: Diagnosis not present

## 2021-04-24 DIAGNOSIS — O09293 Supervision of pregnancy with other poor reproductive or obstetric history, third trimester: Secondary | ICD-10-CM

## 2021-04-24 DIAGNOSIS — Z8759 Personal history of other complications of pregnancy, childbirth and the puerperium: Secondary | ICD-10-CM | POA: Insufficient documentation

## 2021-04-24 DIAGNOSIS — O09893 Supervision of other high risk pregnancies, third trimester: Secondary | ICD-10-CM

## 2021-04-30 ENCOUNTER — Ambulatory Visit (INDEPENDENT_AMBULATORY_CARE_PROVIDER_SITE_OTHER): Payer: BC Managed Care – PPO | Admitting: Obstetrics and Gynecology

## 2021-04-30 ENCOUNTER — Other Ambulatory Visit: Payer: Self-pay

## 2021-04-30 ENCOUNTER — Ambulatory Visit (INDEPENDENT_AMBULATORY_CARE_PROVIDER_SITE_OTHER): Payer: BC Managed Care – PPO

## 2021-04-30 ENCOUNTER — Ambulatory Visit: Payer: BC Managed Care – PPO | Admitting: *Deleted

## 2021-04-30 ENCOUNTER — Other Ambulatory Visit (HOSPITAL_COMMUNITY)
Admission: RE | Admit: 2021-04-30 | Discharge: 2021-04-30 | Disposition: A | Discharge status: Home / Self Care | Payer: BC Managed Care – PPO | Source: Ambulatory Visit | Attending: Obstetrics and Gynecology | Admitting: Obstetrics and Gynecology

## 2021-04-30 VITALS — BP 123/74 | HR 99 | Wt 192.5 lb

## 2021-04-30 DIAGNOSIS — Z8759 Personal history of other complications of pregnancy, childbirth and the puerperium: Secondary | ICD-10-CM

## 2021-04-30 DIAGNOSIS — O0990 Supervision of high risk pregnancy, unspecified, unspecified trimester: Secondary | ICD-10-CM

## 2021-04-30 DIAGNOSIS — O09899 Supervision of other high risk pregnancies, unspecified trimester: Secondary | ICD-10-CM

## 2021-04-30 DIAGNOSIS — Z789 Other specified health status: Secondary | ICD-10-CM

## 2021-04-30 LAB — OB RESULTS CONSOLE GC/CHLAMYDIA: Gonorrhea: NEGATIVE

## 2021-04-30 NOTE — Progress Notes (Signed)
     PRENATAL VISIT NOTE  Subjective:  [name] [name] is a 35 y.o. [id number] at [redacted]w[redacted]d being seen today for ongoing prenatal care.  She is currently monitored for the following issues for this high-risk pregnancy and has History of stillbirth at 24 weeks; Supervision of high risk pregnancy, antepartum; Language barrier; History of preterm delivery, currently pregnant; and Food insecurity on their problem list.  Patient reports no complaints.  Contractions: Irritability. Vag. Bleeding: None.  Movement: Present. Denies leaking of fluid.   The following portions of the patient's history were reviewed and updated as appropriate: allergies, current medications, past family history, past medical history, past social history, past surgical history and problem list.   Objective:   Vitals:   04/30/21 1012  BP: 123/74  Pulse: 99  Weight: 192 lb 8 oz (87.3 kg)    Fetal Status: Fetal Heart Rate (bpm): 147 Fundal Height: 36 cm Movement: Present  Presentation: Vertex  General:  Alert, oriented and cooperative. Patient is in no acute distress.  Skin: Skin is warm and dry. No rash noted.   Cardiovascular: Normal heart rate noted  Respiratory: Normal respiratory effort, no problems with respiration noted  Abdomen: Soft, gravid, appropriate for gestational age.  Pain/Pressure: Present     Pelvic: Cervical exam performed in the presence of a chaperone Dilation: Closed Effacement (%): 50 Station: Ballotable  Extremities: Normal range of motion.  Edema: Trace  Mental Status: Normal mood and affect. Normal behavior. Normal judgment and thought content.   Assessment and Plan:  Pregnancy: [id number] at [redacted]w[redacted]d 1. Supervision of high risk pregnancy, antepartum Routine care.  - Strep Gp B NAA - Cervicovaginal ancillary only( [place])  2. Language barrier Pt declines interpreter today  3. History of stillbirth at 24 weeks Bpp today. Growth on 9/16: 23.7, ceph, 8/8, 50%, 2436gm. Rpt prn Continue with weekly  bpps Recommend delivery at 39wks  4. History of preterm delivery, currently pregnant Continue exp management  Preterm labor symptoms and general obstetric precautions including but not limited to vaginal bleeding, contractions, leaking of fluid and fetal movement were reviewed in detail with the patient. Please refer to After Visit Summary for other counseling recommendations.    Future Appointments  Date Time Provider Department Center  04/30/2021 11:15 AM [facility] NST [facility]    [place] Bing, MD

## 2021-05-01 LAB — CERVICOVAGINAL ANCILLARY ONLY
Chlamydia: NEGATIVE
Comment: NEGATIVE
Comment: NORMAL
Neisseria Gonorrhea: NEGATIVE

## 2021-05-02 LAB — STREP GP B NAA: Strep Gp B NAA: NEGATIVE

## 2021-05-07 ENCOUNTER — Other Ambulatory Visit: Payer: Self-pay

## 2021-05-07 ENCOUNTER — Ambulatory Visit (INDEPENDENT_AMBULATORY_CARE_PROVIDER_SITE_OTHER): Payer: BC Managed Care – PPO | Admitting: Family Medicine

## 2021-05-07 VITALS — BP 108/74 | HR 98 | Wt 192.8 lb

## 2021-05-07 DIAGNOSIS — Z8759 Personal history of other complications of pregnancy, childbirth and the puerperium: Secondary | ICD-10-CM

## 2021-05-07 DIAGNOSIS — O09899 Supervision of other high risk pregnancies, unspecified trimester: Secondary | ICD-10-CM

## 2021-05-07 DIAGNOSIS — O0990 Supervision of high risk pregnancy, unspecified, unspecified trimester: Secondary | ICD-10-CM

## 2021-05-07 NOTE — Progress Notes (Signed)
   PRENATAL VISIT NOTE  Subjective:  [name] [name] is a 35 y.o. K3T4656 at [redacted]w[redacted]d being seen today for ongoing prenatal care.  She is currently monitored for the following issues for this high-risk pregnancy and has History of stillbirth at 24 weeks; Supervision of high risk pregnancy, antepartum; Language barrier; History of preterm delivery, currently pregnant; and Food insecurity on their problem list.  Patient reports  breast pain .  Contractions: Irritability. Vag. Bleeding: None.  Movement: Present. Denies leaking of fluid.   The following portions of the patient's history were reviewed and updated as appropriate: allergies, current medications, past family history, past medical history, past social history, past surgical history and problem list.   Objective:   Vitals:   05/07/21 1036  BP: 108/74  Pulse: 98  Weight: 192 lb 12.8 oz (87.5 kg)    Fetal Status:     Movement: Present     General:  Alert, oriented and cooperative. Patient is in no acute distress.  Skin: Skin is warm and dry. No rash noted.   Cardiovascular: Normal heart rate noted  Respiratory: Normal respiratory effort, no problems with respiration noted  Abdomen: Soft, gravid, appropriate for gestational age.  Pain/Pressure: Present     Pelvic: Cervical exam deferred        Extremities: Normal range of motion.  Edema: Trace  Mental Status: Normal mood and affect. Normal behavior. Normal judgment and thought content.   Assessment and Plan:  Pregnancy: [id number] at [redacted]w[redacted]d 1. Supervision of high risk pregnancy, antepartum Having left out quadrant breast pain  2. History of preterm delivery, currently pregnant Declined 17  3. History of stillbirth at 24 weeks Will request IOL today, at next visit please discuss process Confirmed IOL for 10/19 daytime  Preterm labor symptoms and general obstetric precautions including but not limited to vaginal bleeding, contractions, leaking of fluid and fetal movement were reviewed in  detail with the patient. Please refer to After Visit Summary for other counseling recommendations.   Return in about 1 week (around 05/14/2021) for Routine prenatal care, MD or APP.  Future Appointments  Date Time Provider Department Center  05/11/2021  8:15 AM [facility] NST [facility]  05/11/2021  9:15 AM Herm[place] Staggers, MD [facility]  05/18/2021  9:15 AM [facility] NST [facility]  05/18/2021 10:55 AM Warden Fillers, MD [facility]  05/27/2021  6:45 AM [facility]-LD SCHED ROOM [facility]INDC None    Federico Flake, MD

## 2021-05-11 ENCOUNTER — Encounter: Payer: Self-pay | Admitting: *Deleted

## 2021-05-11 ENCOUNTER — Ambulatory Visit: Payer: BC Managed Care – PPO | Admitting: *Deleted

## 2021-05-11 ENCOUNTER — Other Ambulatory Visit: Payer: Self-pay | Admitting: General Practice

## 2021-05-11 ENCOUNTER — Other Ambulatory Visit: Payer: Self-pay | Admitting: Obstetrics and Gynecology

## 2021-05-11 ENCOUNTER — Other Ambulatory Visit: Payer: Self-pay

## 2021-05-11 ENCOUNTER — Ambulatory Visit (INDEPENDENT_AMBULATORY_CARE_PROVIDER_SITE_OTHER): Payer: BC Managed Care – PPO | Admitting: Obstetrics and Gynecology

## 2021-05-11 ENCOUNTER — Ambulatory Visit (INDEPENDENT_AMBULATORY_CARE_PROVIDER_SITE_OTHER): Payer: BC Managed Care – PPO

## 2021-05-11 ENCOUNTER — Encounter: Payer: Self-pay | Admitting: Obstetrics and Gynecology

## 2021-05-11 VITALS — BP 110/77 | HR 88 | Wt 193.1 lb

## 2021-05-11 DIAGNOSIS — O09899 Supervision of other high risk pregnancies, unspecified trimester: Secondary | ICD-10-CM

## 2021-05-11 DIAGNOSIS — Z8759 Personal history of other complications of pregnancy, childbirth and the puerperium: Secondary | ICD-10-CM | POA: Diagnosis not present

## 2021-05-11 DIAGNOSIS — Z789 Other specified health status: Secondary | ICD-10-CM

## 2021-05-11 DIAGNOSIS — O09299 Supervision of pregnancy with other poor reproductive or obstetric history, unspecified trimester: Secondary | ICD-10-CM

## 2021-05-11 DIAGNOSIS — Z3A36 36 weeks gestation of pregnancy: Secondary | ICD-10-CM

## 2021-05-11 DIAGNOSIS — O0990 Supervision of high risk pregnancy, unspecified, unspecified trimester: Secondary | ICD-10-CM

## 2021-05-11 NOTE — Progress Notes (Signed)
Subjective:  [name] [name] is a 35 y.o. [id number] at [redacted]w[redacted]d being seen today for ongoing prenatal care.  She is currently monitored for the following issues for this high-risk pregnancy and has History of stillbirth at 24 weeks; Supervision of high risk pregnancy, antepartum; Language barrier; History of preterm delivery, currently pregnant; and Food insecurity on their problem list.  Patient reports general discomfort of pregnancy.   . Vag. Bleeding: None.  Movement: Present. Denies leaking of fluid.   The following portions of the patient's history were reviewed and updated as appropriate: allergies, current medications, past family history, past medical history, past social history, past surgical history and problem list. Problem list updated.  Objective:   Vitals:   05/11/21 0931  BP: 110/77  Pulse: 88  Weight: 193 lb 1.6 oz (87.6 kg)    Fetal Status: Fetal Heart Rate (bpm): RNST   Movement: Present     General:  Alert, oriented and cooperative. Patient is in no acute distress.  Skin: Skin is warm and dry. No rash noted.   Cardiovascular: Normal heart rate noted  Respiratory: Normal respiratory effort, no problems with respiration noted  Abdomen: Soft, gravid, appropriate for gestational age. Pain/Pressure: Present     Pelvic:  Cervical exam deferred        Extremities: Normal range of motion.     Mental Status: Normal mood and affect. Normal behavior. Normal judgment and thought content.   Urinalysis:      Assessment and Plan:  Pregnancy: [id number] at [redacted]w[redacted]d  1. Supervision of high risk pregnancy, antepartum Stable IOL scheduled  2. History of preterm delivery, currently pregnant Stable  3. History of stillbirth at 24 weeks NST today, reactive IOL schedule  4. Language barrier Video interrupter used during today's visit  Term labor symptoms and general obstetric precautions including but not limited to vaginal bleeding, contractions, leaking of fluid and fetal movement were  reviewed in detail with the patient. Please refer to After Visit Summary for other counseling recommendations.  Return in about 1 week (around 05/18/2021) for [facility] & NST/BPP as scheduled.   Hermina Staggers, MD

## 2021-05-11 NOTE — Patient Instructions (Signed)

## 2021-05-14 ENCOUNTER — Other Ambulatory Visit: Payer: BC Managed Care – PPO

## 2021-05-14 ENCOUNTER — Encounter: Payer: BC Managed Care – PPO | Admitting: Obstetrics & Gynecology

## 2021-05-18 ENCOUNTER — Ambulatory Visit (INDEPENDENT_AMBULATORY_CARE_PROVIDER_SITE_OTHER): Payer: BC Managed Care – PPO | Admitting: Obstetrics and Gynecology

## 2021-05-18 ENCOUNTER — Other Ambulatory Visit: Payer: Self-pay

## 2021-05-18 ENCOUNTER — Ambulatory Visit (INDEPENDENT_AMBULATORY_CARE_PROVIDER_SITE_OTHER): Payer: BC Managed Care – PPO

## 2021-05-18 ENCOUNTER — Ambulatory Visit: Payer: BC Managed Care – PPO | Admitting: *Deleted

## 2021-05-18 VITALS — BP 108/70 | HR 112 | Wt 194.6 lb

## 2021-05-18 DIAGNOSIS — Z789 Other specified health status: Secondary | ICD-10-CM

## 2021-05-18 DIAGNOSIS — Z8759 Personal history of other complications of pregnancy, childbirth and the puerperium: Secondary | ICD-10-CM

## 2021-05-18 DIAGNOSIS — O09899 Supervision of other high risk pregnancies, unspecified trimester: Secondary | ICD-10-CM

## 2021-05-18 DIAGNOSIS — O0990 Supervision of high risk pregnancy, unspecified, unspecified trimester: Secondary | ICD-10-CM

## 2021-05-18 DIAGNOSIS — Z3A37 37 weeks gestation of pregnancy: Secondary | ICD-10-CM

## 2021-05-18 NOTE — Progress Notes (Signed)

## 2021-05-18 NOTE — Progress Notes (Signed)
   PRENATAL VISIT NOTE  Subjective:  [name] [name] is a 35 y.o. [id number] at [redacted]w[redacted]d being seen today for ongoing prenatal care.  She is currently monitored for the following issues for this high-risk pregnancy and has History of stillbirth at 24 weeks; Supervision of high risk pregnancy, antepartum; Language barrier; History of preterm delivery, currently pregnant; Food insecurity; and [redacted] weeks gestation of pregnancy on their problem list.  Patient doing well with no acute concerns today. She reports  decreased fetal movement earlier, but did not movement during her testing. .  Contractions: Irregular. Vag. Bleeding: None.  Movement: (!) Decreased. Denies leaking of fluid.   The following portions of the patient's history were reviewed and updated as appropriate: allergies, current medications, past family history, past medical history, past social history, past surgical history and problem list. Problem list updated.  Objective:   Vitals:   05/18/21 1001  BP: 108/70  Pulse: (!) 112  Weight: 194 lb 9.6 oz (88.3 kg)    Fetal Status: Fetal Heart Rate (bpm): RNST Fundal Height: 38 cm Movement: (!) Decreased     General:  Alert, oriented and cooperative. Patient is in no acute distress.  Skin: Skin is warm and dry. No rash noted.   Cardiovascular: Normal heart rate noted  Respiratory: Normal respiratory effort, no problems with respiration noted  Abdomen: Soft, gravid, appropriate for gestational age.  Pain/Pressure: Present     Pelvic: Cervical exam deferred        Extremities: Normal range of motion.     Mental Status:  Normal mood and affect. Normal behavior. Normal judgment and thought content.   Assessment and Plan:  Pregnancy: [id number] at [redacted]w[redacted]d  1. Supervision of high risk pregnancy, antepartum Continue routine care IOL at 39 weeks on 05/27/21  2. History of stillbirth at 24 weeks   3. Language barrier Pt declined tele interpreter  4. History of preterm delivery, currently  pregnant   5. [redacted] weeks gestation of pregnancy   Term labor symptoms and general obstetric precautions including but not limited to vaginal bleeding, contractions, leaking of fluid and fetal movement were reviewed in detail with the patient.  Please refer to After Visit Summary for other counseling recommendations.   Return in about 1 week (around 05/25/2021) for NST/BPP as scheduled.  IOL on 10/19, HOB, in person.   Mariel Aloe, MD Faculty Attending Center for [facility]

## 2021-05-18 NOTE — Progress Notes (Signed)
Pt reports decreased fetal movement x2 weeks.  Pt was aware of FM during NST.

## 2021-05-20 ENCOUNTER — Other Ambulatory Visit: Payer: Self-pay | Admitting: Advanced Practice Midwife

## 2021-05-25 ENCOUNTER — Other Ambulatory Visit: Payer: Self-pay

## 2021-05-25 ENCOUNTER — Ambulatory Visit: Payer: BC Managed Care – PPO | Admitting: *Deleted

## 2021-05-25 ENCOUNTER — Ambulatory Visit (INDEPENDENT_AMBULATORY_CARE_PROVIDER_SITE_OTHER): Payer: BC Managed Care – PPO

## 2021-05-25 VITALS — BP 108/77 | HR 97

## 2021-05-25 DIAGNOSIS — Z8759 Personal history of other complications of pregnancy, childbirth and the puerperium: Secondary | ICD-10-CM | POA: Diagnosis not present

## 2021-05-25 NOTE — Progress Notes (Signed)

## 2021-05-27 ENCOUNTER — Inpatient Hospital Stay (HOSPITAL_COMMUNITY): Payer: BC Managed Care – PPO

## 2021-05-27 ENCOUNTER — Inpatient Hospital Stay (HOSPITAL_COMMUNITY)
Admission: AD | Admit: 2021-05-27 | Discharge: 2021-05-29 | DRG: 797 | Disposition: A | Discharge status: Home / Self Care | Payer: BC Managed Care – PPO | Attending: Obstetrics and Gynecology | Admitting: Obstetrics and Gynecology

## 2021-05-27 ENCOUNTER — Encounter (HOSPITAL_COMMUNITY): Payer: Self-pay | Admitting: Family Medicine

## 2021-05-27 DIAGNOSIS — Z302 Encounter for sterilization: Secondary | ICD-10-CM | POA: Diagnosis not present

## 2021-05-27 DIAGNOSIS — Z7982 Long term (current) use of aspirin: Secondary | ICD-10-CM

## 2021-05-27 DIAGNOSIS — O0990 Supervision of high risk pregnancy, unspecified, unspecified trimester: Secondary | ICD-10-CM

## 2021-05-27 DIAGNOSIS — Z3A39 39 weeks gestation of pregnancy: Secondary | ICD-10-CM

## 2021-05-27 DIAGNOSIS — Z5941 Food insecurity: Secondary | ICD-10-CM

## 2021-05-27 DIAGNOSIS — O26893 Other specified pregnancy related conditions, third trimester: Secondary | ICD-10-CM | POA: Diagnosis not present

## 2021-05-27 DIAGNOSIS — Z20822 Contact with and (suspected) exposure to covid-19: Secondary | ICD-10-CM | POA: Diagnosis not present

## 2021-05-27 DIAGNOSIS — Z789 Other specified health status: Secondary | ICD-10-CM | POA: Diagnosis present

## 2021-05-27 DIAGNOSIS — Z8759 Personal history of other complications of pregnancy, childbirth and the puerperium: Secondary | ICD-10-CM

## 2021-05-27 DIAGNOSIS — Z3009 Encounter for other general counseling and advice on contraception: Secondary | ICD-10-CM

## 2021-05-27 DIAGNOSIS — O09899 Supervision of other high risk pregnancies, unspecified trimester: Secondary | ICD-10-CM

## 2021-05-27 DIAGNOSIS — Z603 Acculturation difficulty: Secondary | ICD-10-CM | POA: Diagnosis present

## 2021-05-27 LAB — RESP PANEL BY RT-PCR (FLU A&B, COVID) ARPGX2
Influenza A by PCR: NEGATIVE
Influenza B by PCR: NEGATIVE
SARS Coronavirus 2 by RT PCR: NEGATIVE

## 2021-05-27 LAB — CBC
HCT: 39 % (ref 36.0–46.0)
HCT: 35.3 % — ABNORMAL LOW (ref 36.0–46.0)
Hemoglobin: 12.9 g/dL (ref 12.0–15.0)
Hemoglobin: 11.7 g/dL — ABNORMAL LOW (ref 12.0–15.0)
MCH: 29.3 pg (ref 26.0–34.0)
MCH: 29.8 pg (ref 26.0–34.0)
MCHC: 33.1 g/dL (ref 30.0–36.0)
MCHC: 33.1 g/dL (ref 30.0–36.0)
MCV: 88.6 fL (ref 80.0–100.0)
MCV: 90.1 fL (ref 80.0–100.0)
Platelets: 236 10*3/uL (ref 150–400)
Platelets: 230 10*3/uL (ref 150–400)
RBC: 4.4 MIL/uL (ref 3.87–5.11)
RBC: 3.92 MIL/uL (ref 3.87–5.11)
RDW: 14.6 % (ref 11.5–15.5)
RDW: 14.9 % (ref 11.5–15.5)
WBC: 7.8 10*3/uL (ref 4.0–10.5)
WBC: 12.2 10*3/uL — ABNORMAL HIGH (ref 4.0–10.5)
nRBC: 0 % (ref 0.0–0.2)
nRBC: 0 % (ref 0.0–0.2)

## 2021-05-27 LAB — RPR: RPR Ser Ql: NONREACTIVE

## 2021-05-27 LAB — TYPE AND SCREEN
ABO/RH(D): O POS
Antibody Screen: NEGATIVE

## 2021-05-27 MED ORDER — BUTORPHANOL TARTRATE 1 MG/ML IJ SOLN
INTRAMUSCULAR | Status: AC
  Filled 2021-05-27: qty 2

## 2021-05-27 MED ORDER — OXYCODONE-ACETAMINOPHEN 5-325 MG PO TABS: 1.0000 | ORAL_TABLET | ORAL | Status: DC | PRN

## 2021-05-27 MED ORDER — LIDOCAINE HCL (PF) 1 % IJ SOLN: 30.0000 mL | INTRAMUSCULAR | Status: DC | PRN

## 2021-05-27 MED ORDER — BUTORPHANOL TARTRATE 1 MG/ML IJ SOLN: 2.0000 mg | Freq: Once | INTRAMUSCULAR | Status: DC

## 2021-05-27 MED ORDER — METHYLERGONOVINE MALEATE 0.2 MG/ML IJ SOLN: 0.2000 mg | Freq: Once | INTRAMUSCULAR | Status: AC

## 2021-05-27 MED ORDER — MISOPROSTOL 25 MCG QUARTER TABLET
25.0000 ug | ORAL_TABLET | ORAL | Status: DC | PRN
  Filled 2021-05-27: qty 1

## 2021-05-27 MED ORDER — FENTANYL CITRATE (PF) 100 MCG/2ML IJ SOLN
INTRAMUSCULAR | Status: AC
  Administered 2021-05-27: 100 ug via INTRAVENOUS
  Filled 2021-05-27: qty 2

## 2021-05-27 MED ORDER — BUTORPHANOL TARTRATE 1 MG/ML IJ SOLN
2.0000 mg | Freq: Once | INTRAMUSCULAR | Status: AC
Start: 2021-05-27 — End: 2021-05-27
  Administered 2021-05-27: 2 mg via INTRAVENOUS

## 2021-05-27 MED ORDER — LACTATED RINGERS IV SOLN: 500.0000 mL | INTRAVENOUS | Status: DC | PRN

## 2021-05-27 MED ORDER — OXYTOCIN-SODIUM CHLORIDE 30-0.9 UT/500ML-% IV SOLN
1.0000 m[IU]/min | INTRAVENOUS | Status: DC
  Administered 2021-05-27: 2 m[IU]/min via INTRAVENOUS
  Filled 2021-05-27: qty 500

## 2021-05-27 MED ORDER — MISOPROSTOL 200 MCG PO TABS
200.0000 ug | ORAL_TABLET | Freq: Once | ORAL | Status: AC
  Administered 2021-05-27: 200 ug via RECTAL

## 2021-05-27 MED ORDER — MISOPROSTOL 200 MCG PO TABS
ORAL_TABLET | ORAL | Status: AC
  Administered 2021-05-27: 400 ug via RECTAL
  Filled 2021-05-27: qty 4

## 2021-05-27 MED ORDER — LACTATED RINGERS IV SOLN: INTRAVENOUS | Status: DC

## 2021-05-27 MED ORDER — FENTANYL CITRATE (PF) 100 MCG/2ML IJ SOLN
100.0000 ug | INTRAMUSCULAR | Status: DC | PRN
  Administered 2021-05-27: 100 ug via INTRAVENOUS
  Filled 2021-05-27: qty 2

## 2021-05-27 MED ORDER — OXYCODONE-ACETAMINOPHEN 5-325 MG PO TABS
2.0000 | ORAL_TABLET | ORAL | Status: DC | PRN
Start: 2021-05-27 — End: 2021-05-28

## 2021-05-27 MED ORDER — MISOPROSTOL 200 MCG PO TABS
400.0000 ug | ORAL_TABLET | Freq: Once | ORAL | Status: AC
  Administered 2021-05-27: 400 ug via BUCCAL

## 2021-05-27 MED ORDER — METHYLERGONOVINE MALEATE 0.2 MG/ML IJ SOLN
INTRAMUSCULAR | Status: AC
  Administered 2021-05-27: 0.2 mg via INTRAMUSCULAR
  Filled 2021-05-27: qty 1

## 2021-05-27 MED ORDER — TRANEXAMIC ACID-NACL 1000-0.7 MG/100ML-% IV SOLN
INTRAVENOUS | Status: AC
  Administered 2021-05-27: 1000 mg
  Filled 2021-05-27: qty 100

## 2021-05-27 MED ORDER — ACETAMINOPHEN 325 MG PO TABS: 650.0000 mg | ORAL_TABLET | ORAL | Status: DC | PRN

## 2021-05-27 MED ORDER — MISOPROSTOL 200 MCG PO TABS: 200.0000 ug | ORAL_TABLET | Freq: Once | ORAL | Status: AC

## 2021-05-27 MED ORDER — LACTATED RINGERS IV BOLUS: 1000.0000 mL | Freq: Once | INTRAVENOUS | Status: DC

## 2021-05-27 MED ORDER — TERBUTALINE SULFATE 1 MG/ML IJ SOLN: 0.2500 mg | Freq: Once | INTRAMUSCULAR | Status: DC | PRN

## 2021-05-27 MED ORDER — MISOPROSTOL 200 MCG PO TABS
200.0000 ug | ORAL_TABLET | Freq: Once | ORAL | Status: DC
  Administered 2021-05-27: 400 ug via BUCCAL

## 2021-05-27 MED ORDER — SOD CITRATE-CITRIC ACID 500-334 MG/5ML PO SOLN: 30.0000 mL | ORAL | Status: DC | PRN

## 2021-05-27 MED ORDER — ONDANSETRON HCL 4 MG/2ML IJ SOLN: 4.0000 mg | Freq: Four times a day (QID) | INTRAMUSCULAR | Status: DC | PRN

## 2021-05-27 MED ORDER — OXYTOCIN BOLUS FROM INFUSION
333.0000 mL | Freq: Once | INTRAVENOUS | Status: AC
  Administered 2021-05-27: 333 mL via INTRAVENOUS

## 2021-05-27 MED ORDER — OXYTOCIN-SODIUM CHLORIDE 30-0.9 UT/500ML-% IV SOLN: 2.5000 [IU]/h | INTRAVENOUS | Status: DC

## 2021-05-27 NOTE — Lactation Note (Addendum)
This note was copied from a baby's chart. Lactation Consultation Note  Patient Name: [name] [name] Today's Date: 05/27/2021   Age:33 hours RN on L&D informed LC now wasn't good  time, due to mom having a repair done.  RN will call LC  on Vocera when mom will need  assistance with latching infant at the breast.   RN informed LC mom will be seen on MBU due to PPH in morning mom is sleepy and tired.  Maternal Data    Feeding    LATCH Score                    Lactation Tools Discussed/Used    Interventions    Discharge    Consult Status      [name] [name] 05/27/2021, 9:46 PM

## 2021-05-27 NOTE — Progress Notes (Signed)
LABOR PROGRESS NOTE  [name] [name] is a 35 y.o. H9Q2229 at [redacted]w[redacted]d  presented for IOL for previous hx of IUFD.   Subjective: Comfortable. Ambulating well.   Objective: BP 112/76   Pulse 85   Temp 98.8 F (37.1 C) (Oral)   Ht 5\' 4"  (1.626 m)   Wt 88.8 kg   LMP 09/02/2020   BMI 33.59 kg/m  or  Vitals:   05/27/21 1531 05/27/21 1618 05/27/21 1631 05/27/21 1701  BP: 124/75 118/76 117/75 112/76  Pulse: 82 78 81 85  Temp:      TempSrc:      Weight:      Height:       Dilation: 5 Effacement (%): 80 Station: -3 Presentation: Vertex Exam by:: Dr. [email or web address] FHT: baseline rate 140, moderate variability, -accels, -decels Toco: q2-38min  Labs: Lab Results  Component Value Date   WBC 7.8 05/27/2021   HGB 12.9 05/27/2021   HCT 39.0 05/27/2021   MCV 88.6 05/27/2021   PLT 236 05/27/2021    Patient Active Problem List   Diagnosis Date Noted   History of IUFD 05/27/2021   [redacted] weeks gestation of pregnancy 05/18/2021   Food insecurity 04/09/2021   Language barrier 12/25/2020   History of preterm delivery, currently pregnant 12/25/2020   Supervision of high risk pregnancy, antepartum 11/20/2020   History of stillbirth at 24 weeks 08/05/2011    Assessment / Plan: 35 y.o. 34 at [redacted]w[redacted]d here for IOL for previous hx of IUFD.   Labor: Slow progression. Consider AROM at next check. Head not well applied enough to AROM during this check.  Fetal Wellbeing:  Category I Pain Control:  prn Anticipated MOD:  Vaginal #GBS negative  [redacted]w[redacted]d, DO 05/27/2021, 6:12 PM PGY-1, [facility]

## 2021-05-27 NOTE — Progress Notes (Signed)
LABOR PROGRESS NOTE  [name] [name] is a 35 y.o. [id number] at [redacted]w[redacted]d  presented for IOL for previous hx of IUFD.  Subjective: Comfortable, feeling contractions every 5-6 min.  Objective: BP 111/70   Pulse 77   Temp 98.8 F (37.1 C) (Oral)   Ht 5\' 4"  (1.626 m)   Wt 88.8 kg   LMP 09/02/2020   BMI 33.59 kg/m  or  Vitals:   05/27/21 1209 05/27/21 1231 05/27/21 1308 05/27/21 1331  BP: 112/72 108/81 97/71 111/70  Pulse: 82 85 81 77  Temp:      TempSrc:      Weight:      Height:       Dilation: 4 Effacement (%): 70 Station: -3 Presentation: Vertex Exam by:: Dr. [email or web address] FHT: baseline rate 140, moderate variability, +accels, -decels Toco: q2-43min  Labs: Lab Results  Component Value Date   WBC 7.8 05/27/2021   HGB 12.9 05/27/2021   HCT 39.0 05/27/2021   MCV 88.6 05/27/2021   PLT 236 05/27/2021    Patient Active Problem List   Diagnosis Date Noted   History of IUFD 05/27/2021   [redacted] weeks gestation of pregnancy 05/18/2021   Food insecurity 04/09/2021   Language barrier 12/25/2020   History of preterm delivery, currently pregnant 12/25/2020   Supervision of high risk pregnancy, antepartum 11/20/2020   History of stillbirth at 24 weeks 08/05/2011    Assessment / Plan: 35 y.o. 33 at [redacted]w[redacted]d here for IOL for previous hx of IUFD.  Labor: Progressing. Continue pitocin. Fetal Wellbeing:  Category I Pain Control:  prn Anticipated MOD:  Vaginal #GBS negative  [redacted]w[redacted]d, DO 05/27/2021, 1:54 PM PGY-1, [facility]

## 2021-05-27 NOTE — Discharge Summary (Addendum)
Postpartum Discharge Summary     Patient Name: [name] [name] DOB: 1985/09/08 MRN: [id number]  Date of admission: 05/27/2021 Delivery date:05/27/2021  Delivering provider: Lattie Corns  Date of discharge: 05/29/2021  Admitting diagnosis: History of IUFD [Z87.59] Intrauterine pregnancy: [redacted]w[redacted]d    Secondary diagnosis:  Active Problems:   History of stillbirth at 27 weeks  Language barrier   History of preterm delivery, currently pregnant   History of IUFD   Postpartum hemorrhage   Unwanted fertility  Additional problems: none    Discharge diagnosis: Term Pregnancy Delivered and PPH                                              Post partum procedures:postpartum tubal ligation Augmentation: AROM and Pitocin Complications: [id number] Hospital course: Induction of Labor With Vaginal Delivery   35y.o. yo [id number] admitted to the hospital 05/27/2021 for induction of labor.  Indication for induction:  hx of 24wk IUFD .  Patient had an unremarkable labor course, however 3rd stage was remarkable for spont placenta ~ 28100m after baby, followed by brisk bleeding which ultimately was classified as a post partum hemorrhage; she was given the usual PPH meds with good response. EBL 1020cc. Hgb 11.7 immediately afterwards and decreased to 10.4 on PPD#1 (12.9 on admit).  Membrane Rupture Time/Date: 7:39 PM ,05/27/2021   Delivery Method:Vaginal, Spontaneous  Episiotomy: None  Lacerations:  None  Details of delivery can be found in separate delivery note.  Patient had a routine postpartum course. Patient is discharged home 05/29/21.  Newborn Data: Birth date:05/27/2021  Birth time:9:15 PM  Gender:Female  Living status:Living  Apgars:9 ,9  Weight:7 lb 0.7 oz (3.195 kg) (7lb 0.7oz)  Magnesium Sulfate received: No BMZ received: No Rhophylac:N/A MMR:N/A T-DaP:Given prenatally Flu: Yes Transfusion:No  Physical exam  Vitals:   05/28/21 1903 05/28/21 2059 05/29/21 0045  05/29/21 0500  BP: 107/70 (!) 94/56 109/65 (!) 94/57  Pulse: 86 97 70 82  Resp: 17 17 17 17   Temp: 98 F (36.7 C) 98.5 F (36.9 C) 97.7 F (36.5 C) 97.7 F (36.5 C)  TempSrc: Oral Oral Oral Oral  SpO2: 98% 100% 100% 98%  Weight:      Height:       General: alert, cooperative, and no distress Lochia: appropriate Uterine Fundus: firm Incision: Healing well with no significant drainage, No significant erythema DVT Evaluation: No evidence of DVT seen on physical exam. No significant calf/ankle edema. Labs: Lab Results  Component Value Date   WBC 16.3 (H) 05/28/2021   HGB 10.4 (L) 05/28/2021   HCT 30.9 (L) 05/28/2021   MCV 88.3 05/28/2021   PLT 213 05/28/2021   CMP Latest Ref Rng & Units 08/05/2011  Glucose 70 - 99 mg/dL 112(H)  BUN 6 - 23 mg/dL 6  Creatinine 0.50 - 1.10 mg/dL 0.48(L)  Sodium 135 - 145 mEq/L 135  Potassium 3.5 - 5.1 mEq/L 3.3(L)  Chloride 96 - 112 mEq/L 103  CO2 19 - 32 mEq/L 21  Calcium 8.4 - 10.5 mg/dL 8.7  Total Protein 6.0 - 8.3 g/dL 7.6  Total Bilirubin 0.3 - 1.2 mg/dL 0.3  Alkaline Phos 39 - 117 U/L 123(H)  AST 0 - 37 U/L 50(H)  ALT 0 - 35 U/L 33   Edinburgh Score: Edinburgh Postnatal Depression Scale Screening Tool 05/28/2021  I  have been able to laugh and see the funny side of things. 0  I have looked forward with enjoyment to things. 0  I have blamed myself unnecessarily when things went wrong. 0  I have been anxious or worried for no good reason. 0  I have felt scared or panicky for no good reason. 0  Things have been getting on top of me. 0  I have been so unhappy that I have had difficulty sleeping. 0  I have felt sad or miserable. 0  I have been so unhappy that I have been crying. 0  The thought of harming myself has occurred to me. 0  Edinburgh Postnatal Depression Scale Total 0     After visit meds:  Allergies as of 05/29/2021   No Known Allergies      Medication List     STOP taking these medications    aspirin EC 81  MG tablet       TAKE these medications    acetaminophen 325 MG tablet Commonly known as: Tylenol Take 2 tablets (650 mg total) by mouth every 4 (four) hours as needed for mild pain or moderate pain (for pain scale < 4).   Blood Pressure Kit Devi 1 Device by Does not apply route as needed. To monitor BP at home weekly high risk o09.90   ibuprofen 600 MG tablet Commonly known as: ADVIL Take 1 tablet (600 mg total) by mouth every 6 (six) hours as needed for mild pain or moderate pain.   oxyCODONE 5 MG immediate release tablet Commonly known as: Roxicodone Take 1 tablet (5 mg total) by mouth every 8 (eight) hours as needed.   PreNatal Vitamins Plus 27-1 MG Tabs Take 1 tablet by mouth daily.         Discharge home in stable condition Infant Feeding: Bottle and Breast Infant Disposition:home with mother Discharge instruction: per After Visit Summary and Postpartum booklet. Activity: Advance as tolerated. Pelvic rest for 6 weeks.  Diet: routine diet Future Appointments: Future Appointments  Date Time Provider [place]  06/25/2021  9:15 AM Donnamae Jude, MD [facility]   Follow up Visit:  Myrtis Ser, CNM  P [facility] Admin Pool Please schedule this patient for Postpartum visit in: 4 weeks with the following provider: Any provider  In-Person  High risk pregnancy complicated by: hx 16XW IUFD  Delivery mode:  SVD  Anticipated Birth Control:  BTL done PP  PP Procedures needed: none  Schedule Integrated BH visit: no   Lattie Corns, PGY-1, Faculty Service   GME ATTESTATION:  I saw and evaluated the patient. I agree with the findings and the plan of care as documented in the resident's note and have made all necessary edits.  Renard Matter, MD, MPH OB Fellow, [employer] [facility] 05/29/2021 9:44 AM

## 2021-05-27 NOTE — Progress Notes (Signed)
LABOR PROGRESS NOTE  [name] [name] is a 35 y.o. W2O3785 at [redacted]w[redacted]d presented for IOL for previous hx of IUFD.  Subjective: Comfortable. Feeling contractions more intensely.  Objective: BP 119/73   Pulse 84   Temp 98.8 F (37.1 C) (Oral)   Ht 5\' 4"  (1.626 m)   Wt 88.8 kg   LMP 09/02/2020   BMI 33.59 kg/m  or  Vitals:   05/27/21 1826 05/27/21 1837 05/27/21 1910 05/27/21 1931  BP: 120/77 117/65 116/76 119/73  Pulse: 87 92 84 84  Temp:      TempSrc:      Weight:      Height:       Dilation: 5.5 Effacement (%): 80 Station: -2 Presentation: Vertex Exam by:: Dr. [email or web address] FHT: baseline rate 140, moderate variability, +accels, -decels Toco: q2-88min  Labs: Lab Results  Component Value Date   WBC 7.8 05/27/2021   HGB 12.9 05/27/2021   HCT 39.0 05/27/2021   MCV 88.6 05/27/2021   PLT 236 05/27/2021    Patient Active Problem List   Diagnosis Date Noted   History of IUFD 05/27/2021   [redacted] weeks gestation of pregnancy 05/18/2021   Food insecurity 04/09/2021   Language barrier 12/25/2020   History of preterm delivery, currently pregnant 12/25/2020   Supervision of high risk pregnancy, antepartum 11/20/2020   History of stillbirth at 24 weeks 08/05/2011    Assessment / Plan: 35 y.o. 33 at [redacted]w[redacted]d here for IOL for previous hx of IUFD.  Labor: AROM'd for clears. Recheck in 4 hours. Consider IUPC dependent on progress. Fetal Wellbeing:  Category I Pain Control:  prn Anticipated MOD:  Vaginal #GBS negative  [redacted]w[redacted]d, DO 05/27/2021, 7:49 PM PGY-1, [facility]

## 2021-05-27 NOTE — H&P (Signed)
OBSTETRIC ADMISSION HISTORY AND PHYSICAL  [name] [name] is a 35 y.o. female [id number] with IUP at 78w0dby [place] for induction of labor for hx IUFD at 238 weeksin 2012. Two successful term vaginal deliveries since then. She reports +FMs, No LOF, no VB, no blurry vision, headaches or peripheral edema, and RUQ pain.  She plans on breast and bottle feeding. She requests BTL (papers signed 03/16/2021) for birth control. [facility]  Dating: By 12/29/20 [place]--->  Estimated Date of Delivery: 06/03/21  Sono:    _0 , CWD, normal anatomy, cephalic presentation, placenta anterior, 2436g, 50% EFW   Prenatal History/Complications: history of preterm delivery, history of IUFD, food insecurity  Past Medical History: Past Medical History:  Diagnosis Date   History of stillbirth at 278 weeks12/27/2012   24 weeks, possibly 2/2 Toxo. Per MFM consult pt needs serial growth US's. Antenatal testing not indicated 2/2 Prev IUFD <28 weeks _1 23 40%tile _2 32 scheduled _3 36  scheduled  Recommendations 1) No further work up needed due to previous IUFD (unexplained at this point).  Had normal APS work up with her last pregnancy. 2) Recommend serial ultrasounds for growth given history of previous IUFD      Preterm labor     Past Surgical History: Past Surgical History:  Procedure Laterality Date   NO PAST SURGERIES      Obstetrical History: OB History     Gravida  5   Para  4   Term  2   Preterm  2   AB  0   Living  3      SAB  0   IAB  0   Ectopic  0   Multiple  0   Live Births  3           Social History Social History   Socioeconomic History   Marital status: Married    Spouse name: Not on file   Number of children: Not on file   Years of education: Not on file   Highest education level: Not on file  Occupational History   Not on file  Tobacco Use   Smoking status: Never   Smokeless tobacco: Never  Vaping Use   Vaping Use: Never used   Substance and Sexual Activity   Alcohol use: No   Drug use: No   Sexual activity: Not Currently    Birth control/protection: None    Comment: delivered fetal demise 08/04/11  Other Topics Concern   Not on file  Social History Narrative   Not on file   Social Determinants of Health   Financial Resource Strain: Not on file  Food Insecurity: Food Insecurity Present   Worried About R[place]tonin the Last Year: Sometimes true   Ran Out of Food in the Last Year: Never true  Transportation Needs: No Transportation Needs   Lack of Transportation (Medical): No   Lack of Transportation (Non-Medical): No  Physical Activity: Not on file  Stress: Not on file  Social Connections: Not on file    Family History: Family History  Problem Relation Age of Onset   Kidney disease Father    Diabetes Father    Hypertension Father     Allergies: No Known Allergies  Medications Prior to Admission  Medication Sig Dispense Refill Last Dose   aspirin EC 81 MG tablet Take 1 tablet (81 mg total) by mouth daily. 60 tablet 2    Blood  Pressure Monitoring (BLOOD PRESSURE KIT) DEVI 1 Device by Does not apply route as needed. To monitor BP at home weekly high risk o09.90 (Patient not taking: Reported on 05/18/2021) 1 each 0    Prenatal Vit-Fe Fumarate-FA (PRENATAL VITAMINS PLUS) 27-1 MG TABS Take 1 tablet by mouth daily. 30 tablet 2      Review of Systems   All systems reviewed and negative except as stated in HPI  Height _0  (1.626 m), weight 88.8 kg, last menstrual period 09/02/2020, unknown if currently breastfeeding. General appearance: alert, cooperative, and no distress Lungs: Normal WOB  Heart: regular rate and rhythm Abdomen: gravid Pelvic: See cervical exam below Extremities: No edema, no sign of DVT Presentation: cephalic by sutures and BSUS  Fetal monitoring Baseline: 145 bpm, Variability: Good {> 6 bpm), Accelerations: Reactive, and Decelerations: Absent Uterine  activityNone Dilation: 3 Effacement (%): 70 Station: -3 Exam by:: Dr. Higinio Plan   Prenatal labs: ABO, Rh: O/Positive/-- (04/14 1000) Antibody: Negative (04/14 1000) Rubella: 1.51 (04/14 1000) RPR: Non Reactive (08/08 0819)  HBsAg: Negative (04/14 1000)  HIV: Non Reactive (08/08 0819)  GBS: Negative/-- (09/22 1035)  1 hr Glucola normal Genetic screening: NIPS low risk female, AFP neg, Horizon neg Anatomy US normal  Prenatal Transfer Tool  Maternal Diabetes: No Genetic Screening: Normal Maternal Ultrasounds/Referrals: Normal Fetal Ultrasounds or other Referrals:  None Maternal Substance Abuse:  No Significant Maternal Medications:  None Significant Maternal Lab Results: Group B Strep negative  Results for orders placed or performed during the hospital encounter of 05/27/21 (from the past 24 hour(s))  CBC   Collection Time: 05/27/21  8:13 AM  Result Value Ref Range   WBC 7.8 4.0 - 10.5 K/uL   RBC 4.40 3.87 - 5.11 MIL/uL   Hemoglobin 12.9 12.0 - 15.0 g/dL   HCT 39.0 36.0 - 46.0 %   MCV 88.6 80.0 - 100.0 fL   MCH 29.3 26.0 - 34.0 pg   MCHC 33.1 30.0 - 36.0 g/dL   RDW 14.6 11.5 - 15.5 %   Platelets 236 150 - 400 K/uL   nRBC 0.0 0.0 - 0.2 %    Patient Active Problem List   Diagnosis Date Noted   History of IUFD 05/27/2021   [redacted] weeks gestation of pregnancy 05/18/2021   Food insecurity 04/09/2021   Language barrier 12/25/2020   History of preterm delivery, currently pregnant 12/25/2020   Supervision of high risk pregnancy, antepartum 11/20/2020   History of stillbirth at 76 weeks 08/05/2011    Assessment/Plan:  [name] [name] is a 35 y.o. H7G9021 at 73w0dpresenting for IOL due to history of IUFD at 276 weeksin 2012.   #Labor: Reassuring initial cervical exam, will start with pit 2x2  #Pain: PRN  #FWB: Cat 1  #ID: GBS neg #MOF: breast and bottle #MOC: BTL (consent signed 03/16/21), she is aware that she will be evaluated postpartum to consider pp vs interval BTL.  #Circ:  no  SPatriciaann Clan DO 05/27/2021, 9:33 AM

## 2021-05-28 ENCOUNTER — Encounter (HOSPITAL_COMMUNITY): Payer: Self-pay | Admitting: Obstetrics and Gynecology

## 2021-05-28 ENCOUNTER — Inpatient Hospital Stay (HOSPITAL_COMMUNITY): Payer: BC Managed Care – PPO | Admitting: Anesthesiology

## 2021-05-28 ENCOUNTER — Other Ambulatory Visit: Payer: Self-pay

## 2021-05-28 ENCOUNTER — Encounter (HOSPITAL_COMMUNITY)
Admission: AD | Disposition: A | Discharge status: Home / Self Care | Payer: Self-pay | Source: Home / Self Care | Attending: Obstetrics and Gynecology

## 2021-05-28 DIAGNOSIS — Z3009 Encounter for other general counseling and advice on contraception: Secondary | ICD-10-CM

## 2021-05-28 DIAGNOSIS — Z302 Encounter for sterilization: Secondary | ICD-10-CM

## 2021-05-28 HISTORY — PX: TUBAL LIGATION: SHX77

## 2021-05-28 LAB — CBC
HCT: 30.9 % — ABNORMAL LOW (ref 36.0–46.0)
Hemoglobin: 10.4 g/dL — ABNORMAL LOW (ref 12.0–15.0)
MCH: 29.7 pg (ref 26.0–34.0)
MCHC: 33.7 g/dL (ref 30.0–36.0)
MCV: 88.3 fL (ref 80.0–100.0)
Platelets: 213 10*3/uL (ref 150–400)
RBC: 3.5 MIL/uL — ABNORMAL LOW (ref 3.87–5.11)
RDW: 14.6 % (ref 11.5–15.5)
WBC: 16.3 10*3/uL — ABNORMAL HIGH (ref 4.0–10.5)
nRBC: 0 % (ref 0.0–0.2)

## 2021-05-28 SURGERY — LIGATION, FALLOPIAN TUBE, POSTPARTUM
Anesthesia: Spinal | Laterality: Bilateral | Wound class: Clean Contaminated

## 2021-05-28 MED ORDER — TETANUS-DIPHTH-ACELL PERTUSSIS 5-2.5-18.5 LF-MCG/0.5 IM SUSY: 0.5000 mL | PREFILLED_SYRINGE | Freq: Once | INTRAMUSCULAR | Status: DC

## 2021-05-28 MED ORDER — BUPIVACAINE HCL (PF) 0.25 % IJ SOLN: 30.0000 mL | Freq: Once | INTRAMUSCULAR | Status: DC

## 2021-05-28 MED ORDER — ZOLPIDEM TARTRATE 5 MG PO TABS: 5.0000 mg | ORAL_TABLET | Freq: Every evening | ORAL | Status: DC | PRN

## 2021-05-28 MED ORDER — DIBUCAINE (PERIANAL) 1 % EX OINT: 1.0000 "application " | TOPICAL_OINTMENT | CUTANEOUS | Status: DC | PRN

## 2021-05-28 MED ORDER — MIDAZOLAM HCL 5 MG/5ML IJ SOLN
INTRAMUSCULAR | Status: DC | PRN
  Administered 2021-05-28 (×2): 1 mg via INTRAVENOUS

## 2021-05-28 MED ORDER — PRENATAL MULTIVITAMIN CH: 1.0000 | ORAL_TABLET | Freq: Every day | ORAL | Status: DC

## 2021-05-28 MED ORDER — STERILE WATER FOR IRRIGATION IR SOLN
Status: DC | PRN
  Administered 2021-05-28: 1000 mL

## 2021-05-28 MED ORDER — IBUPROFEN 600 MG PO TABS
600.0000 mg | ORAL_TABLET | Freq: Four times a day (QID) | ORAL | Status: DC
  Administered 2021-05-28 – 2021-05-29 (×4): 600 mg via ORAL
  Filled 2021-05-28 (×4): qty 1

## 2021-05-28 MED ORDER — BUPIVACAINE HCL (PF) 0.25 % IJ SOLN
INTRAMUSCULAR | Status: DC | PRN
  Administered 2021-05-28: 10 mL

## 2021-05-28 MED ORDER — SIMETHICONE 80 MG PO CHEW: 80.0000 mg | CHEWABLE_TABLET | ORAL | Status: DC | PRN

## 2021-05-28 MED ORDER — LACTATED RINGERS IV SOLN: INTRAVENOUS | Status: DC | PRN

## 2021-05-28 MED ORDER — IBUPROFEN 600 MG PO TABS: 600.0000 mg | ORAL_TABLET | Freq: Four times a day (QID) | ORAL | Status: DC

## 2021-05-28 MED ORDER — SENNOSIDES-DOCUSATE SODIUM 8.6-50 MG PO TABS
2.0000 | ORAL_TABLET | Freq: Every day | ORAL | Status: DC
  Administered 2021-05-29: 2 via ORAL
  Filled 2021-05-28: qty 2

## 2021-05-28 MED ORDER — OXYCODONE HCL 5 MG PO TABS
10.0000 mg | ORAL_TABLET | ORAL | Status: DC | PRN
Start: 2021-05-28 — End: 2021-05-29

## 2021-05-28 MED ORDER — MIDAZOLAM HCL 2 MG/2ML IJ SOLN
INTRAMUSCULAR | Status: AC
  Filled 2021-05-28: qty 2

## 2021-05-28 MED ORDER — ONDANSETRON HCL 4 MG PO TABS: 4.0000 mg | ORAL_TABLET | ORAL | Status: DC | PRN

## 2021-05-28 MED ORDER — FENTANYL CITRATE (PF) 100 MCG/2ML IJ SOLN
INTRAMUSCULAR | Status: DC | PRN
  Administered 2021-05-28 (×2): 50 ug via INTRAVENOUS

## 2021-05-28 MED ORDER — BENZOCAINE-MENTHOL 20-0.5 % EX AERO: 1.0000 "application " | INHALATION_SPRAY | CUTANEOUS | Status: DC | PRN

## 2021-05-28 MED ORDER — SENNOSIDES-DOCUSATE SODIUM 8.6-50 MG PO TABS: 2.0000 | ORAL_TABLET | ORAL | Status: DC

## 2021-05-28 MED ORDER — FENTANYL CITRATE (PF) 100 MCG/2ML IJ SOLN
INTRAMUSCULAR | Status: AC
  Filled 2021-05-28: qty 2

## 2021-05-28 MED ORDER — COCONUT OIL OIL: 1.0000 "application " | TOPICAL_OIL | Status: DC | PRN

## 2021-05-28 MED ORDER — SODIUM CHLORIDE 0.9% FLUSH: 3.0000 mL | Freq: Two times a day (BID) | INTRAVENOUS | Status: DC

## 2021-05-28 MED ORDER — ONDANSETRON HCL 4 MG/2ML IJ SOLN: 4.0000 mg | INTRAMUSCULAR | Status: DC | PRN

## 2021-05-28 MED ORDER — FAMOTIDINE 20 MG PO TABS
40.0000 mg | ORAL_TABLET | Freq: Once | ORAL | Status: AC
  Administered 2021-05-28: 40 mg via ORAL
  Filled 2021-05-28: qty 2

## 2021-05-28 MED ORDER — DIPHENHYDRAMINE HCL 25 MG PO CAPS: 25.0000 mg | ORAL_CAPSULE | Freq: Four times a day (QID) | ORAL | Status: DC | PRN

## 2021-05-28 MED ORDER — BUPIVACAINE IN DEXTROSE 0.75-8.25 % IT SOLN
INTRATHECAL | Status: DC | PRN
  Administered 2021-05-28: 1.6 mg via INTRATHECAL

## 2021-05-28 MED ORDER — ACETAMINOPHEN 325 MG PO TABS: 650.0000 mg | ORAL_TABLET | ORAL | Status: DC | PRN

## 2021-05-28 MED ORDER — METOCLOPRAMIDE HCL 10 MG PO TABS
10.0000 mg | ORAL_TABLET | Freq: Once | ORAL | Status: AC
  Administered 2021-05-28: 10 mg via ORAL
  Filled 2021-05-28: qty 1

## 2021-05-28 MED ORDER — ONDANSETRON HCL 4 MG/2ML IJ SOLN
INTRAMUSCULAR | Status: AC
  Filled 2021-05-28: qty 2

## 2021-05-28 MED ORDER — PRENATAL MULTIVITAMIN CH
1.0000 | ORAL_TABLET | Freq: Every day | ORAL | Status: DC
  Administered 2021-05-29: 1 via ORAL
  Filled 2021-05-28: qty 1

## 2021-05-28 MED ORDER — FENTANYL CITRATE (PF) 100 MCG/2ML IJ SOLN: 25.0000 ug | INTRAMUSCULAR | Status: DC | PRN

## 2021-05-28 MED ORDER — OXYCODONE HCL 5 MG PO TABS
5.0000 mg | ORAL_TABLET | ORAL | Status: DC | PRN
  Administered 2021-05-28: 5 mg via ORAL
  Filled 2021-05-28: qty 1

## 2021-05-28 MED ORDER — WITCH HAZEL-GLYCERIN EX PADS: 1.0000 "application " | MEDICATED_PAD | CUTANEOUS | Status: DC | PRN

## 2021-05-28 MED ORDER — BUPIVACAINE HCL (PF) 0.25 % IJ SOLN
INTRAMUSCULAR | Status: AC
  Filled 2021-05-28: qty 30

## 2021-05-28 MED ORDER — LACTATED RINGERS IV SOLN
INTRAVENOUS | Status: DC
  Administered 2021-05-28: 1000 mL via INTRAVENOUS

## 2021-05-28 MED ORDER — BUPIVACAINE HCL (PF) 0.25 % IJ SOLN
INTRAMUSCULAR | Status: AC
  Filled 2021-05-28: qty 10

## 2021-05-28 MED ORDER — SODIUM CHLORIDE 0.9 % IR SOLN
Status: DC | PRN
  Administered 2021-05-28: 1000 mL

## 2021-05-28 MED ORDER — MEASLES, MUMPS & RUBELLA VAC IJ SOLR: 0.5000 mL | Freq: Once | INTRAMUSCULAR | Status: DC

## 2021-05-28 MED ORDER — SODIUM CHLORIDE 0.9% FLUSH: 3.0000 mL | INTRAVENOUS | Status: DC | PRN

## 2021-05-28 MED ORDER — ONDANSETRON HCL 4 MG/2ML IJ SOLN
INTRAMUSCULAR | Status: DC | PRN
  Administered 2021-05-28: 4 mg via INTRAVENOUS

## 2021-05-28 MED ORDER — SODIUM CHLORIDE 0.9 % IV SOLN: 250.0000 mL | INTRAVENOUS | Status: DC | PRN

## 2021-05-28 SURGICAL SUPPLY — 26 items
CLOTH BEACON ORANGE TIMEOUT ST (SAFETY) ×2 IMPLANT
COVER LIGHT HANDLE  1/PK (MISCELLANEOUS) ×1
COVER LIGHT HANDLE 1/PK (MISCELLANEOUS) ×1 IMPLANT
DRSG OPSITE POSTOP 3X4 (GAUZE/BANDAGES/DRESSINGS) ×2 IMPLANT
DURAPREP 26ML APPLICATOR (WOUND CARE) ×2 IMPLANT
ELECT REM PT RETURN 9FT ADLT (ELECTROSURGICAL) ×2
ELECTRODE REM PT RTRN 9FT ADLT (ELECTROSURGICAL) ×1 IMPLANT
GLOVE BIOGEL PI IND STRL 7.0 (GLOVE) ×1 IMPLANT
GLOVE BIOGEL PI INDICATOR 7.0 (GLOVE) ×1
GLOVE SURG ORTHO 8.0 STRL STRW (GLOVE) ×2 IMPLANT
GOWN STRL REUS W/TWL LRG LVL3 (GOWN DISPOSABLE) ×4 IMPLANT
LIGASURE IMPACT 36 18CM CVD LR (INSTRUMENTS) ×2 IMPLANT
NEEDLE HYPO 22GX1.5 SAFETY (NEEDLE) ×2 IMPLANT
NS IRRIG 1000ML POUR BTL (IV SOLUTION) ×2 IMPLANT
PACK ABDOMINAL MINOR (CUSTOM PROCEDURE TRAY) ×2 IMPLANT
PENCIL SMOKE EVAC W/HOLSTER (ELECTROSURGICAL) ×2 IMPLANT
PROTECTOR NERVE ULNAR (MISCELLANEOUS) ×2 IMPLANT
SPONGE LAP 4X18 RFD (DISPOSABLE) ×2 IMPLANT
SPONGE T-LAP 18X18 ~~LOC~~+RFID (SPONGE) ×2 IMPLANT
SPONGE T-LAP 4X18 ~~LOC~~+RFID (SPONGE) ×2 IMPLANT
SUT MNCRL AB 3-0 PS2 27 (SUTURE) ×2 IMPLANT
SUT PLAIN 0 NONE (SUTURE) ×2 IMPLANT
SUT VICRYL 0 UR6 27IN ABS (SUTURE) ×2 IMPLANT
SYR CONTROL 10ML LL (SYRINGE) ×2 IMPLANT
TOWEL OR 17X24 6PK STRL BLUE (TOWEL DISPOSABLE) ×4 IMPLANT
TRAY FOLEY W/BAG SLVR 14FR LF (SET/KITS/TRAYS/PACK) ×2 IMPLANT

## 2021-05-28 NOTE — Progress Notes (Signed)
   Pt seen prior to procedure.  Abdomen examined.  Fundus is 1 below umbilicus.  BMI is 34 and fascia does feel somewhat deep.  Pt is adamant that she wants her postpartum tubal today.  She is currently NPO and is scheduled for a 1500 procedure.  Risks and benefits of the procedure have been given including bleeding, infection and involvement of other organs including bowel.  Pt wishes to proceed.  Tubal consent previously signed 03/16/2021.   Mariel Aloe, MD Faculty attending, [facility] health care.

## 2021-05-28 NOTE — Lactation Note (Addendum)
This note was copied from a baby's chart. Lactation Consultation Note  Patient Name: [name] Today's Date: 05/28/2021 Reason for consult: Initial assessment;Term;Infant weight loss;Breastfeeding assistance;Other (Comment) (baby is 54 hours old / and mom preferred per LC 11-3p for LC in am. baby latched on the Rt. breast when LC entered the room with depth/ swallows and per mom comfortable . Burmese - [name] - [phone] mom will need a hand pump prior to D/C) Age:43 hours/ LC noted mom did extra bleeding PP > 1000 ml EBL  Baby wrapped in a blanket latched when LC entered. LC recommended to mom to feed with feeding cues / ( 8-12 times in 24 hours ) and the importance of STS for feedings.   Maternal Data Has patient been taught Hand Expression?: Yes (drops . some areola edema noted - LC showed mom how to use the reverse pressure technique prior to latch.) Does the patient have breastfeeding experience prior to this delivery?: Yes How long did the patient breastfeed?: per mom x 2 babies - now 69 and 71 years old - for 1 year each  Feeding Mother's Current Feeding Choice: Breast Milk and Formula  LATCH Score Latch:  (latched with depth / pillow support)  Audible Swallowing:  (swallows)  Type of Nipple:  (nipple slightly slanted when baby6 released at 10 mins)  Comfort (Breast/Nipple):  (per mom comfortable)  Hold (Positioning):  (mom latched the baby by herself)      Lactation Tools Discussed/Used    Interventions Interventions: Breast feeding basics reviewed;Hand express;Education (mom will need a Hand pump prior to D/C) LC brochure provided Discharge WIC Program: Yes  Consult Status Consult Status: Follow-up Date: 05/28/21 Follow-up type: In-patient    Matilde Sprang Michalle Rademaker 05/28/2021, 8:41 AM

## 2021-05-28 NOTE — Anesthesia Procedure Notes (Signed)
Spinal  Patient location during procedure: OR Start time: 05/28/2021 3:17 PM End time: 05/28/2021 3:27 PM Reason for block: surgical anesthesia Staffing Performed: anesthesiologist  Anesthesiologist: Marcene Duos, MD Preanesthetic Checklist Completed: patient identified, IV checked, site marked, risks and benefits discussed, surgical consent, monitors and equipment checked, pre-op evaluation and timeout performed Spinal Block Patient position: sitting Prep: DuraPrep Patient monitoring: heart rate, cardiac monitor, continuous pulse ox and blood pressure Approach: midline Location: L3-4 Injection technique: single-shot Needle Needle type: Pencan and Introducer  Needle gauge: 24 G Needle length: 9 cm Assessment Sensory level: T4 Events: CSF return

## 2021-05-28 NOTE — Op Note (Signed)
[name] [name] 05/27/2021 - 05/28/2021  PREOPERATIVE DIAGNOSES: Multiparity, undesired fertility  POSTOPERATIVE DIAGNOSES: Multiparity, undesired fertility  PROCEDURE:  Postpartum Bilateral Partial Salpingectomy  SURGEON: Mariel Aloe, MD  Assistant: Leticia Penna, DO  ANESTHESIA:  Epidural and local analgesia using 10 ml of 0.5% Marcaine  COMPLICATIONS:  None immediate.  ESTIMATED BLOOD LOSS: 5 ml.  INDICATIONS:  35 y.o. W7P7106 with undesired fertility, status post vaginal delivery, desires permanent sterilization.  Other reversible forms of contraception were discussed with patient; she declines all other modalities. Risks of procedure discussed with patient including but not limited to: risk of regret, permanence of method, bleeding, infection, injury to surrounding organs and need for additional procedures.  Failure risk of 1% with increased risk of ectopic gestation if pregnancy occurs was also discussed with patient.      FINDINGS:  Normal uterus, tubes, and ovaries.  PROCEDURE DETAILS: The patient was taken to the operating room where her epidural anesthesia was dosed up to surgical level and found to be adequate.  She was then placed in the dorsal supine position and prepped and draped in sterile fashion.  After an adequate timeout was performed, attention was turned to the patient's abdomen where a small transverse skin incision was made under the umbilical fold. The incision was taken down to the layer of fascia using the scalpel, and fascia was incised, and extended bilaterally using Mayo scissors. The peritoneum was entered in a sharp fashion. Attention was then turned to the patient's uterus, the right fallopian tube was grasped with Babcock clamps and followed out to the fimbriated end. The distal 5 cm portion of the tube and mesosalpinx was clamped using the Ligasure device.  The mesosalpinx was sequentially divided with double cautery followed by a cut.  2/3 of the tube including  the fimbriated end was removed.   The remaining pedicle was inspected and found to be hemostatic.  A similar process was carried out on the left side allowing for bilateral tubal sterilization.  Good hemostasis was noted overall. The instruments were then removed from the patient's abdomen and the fascial incision was repaired with 0 Vicryl, and the skin was closed with a 4-0 monocryl subcuticular stitch.   The patient tolerated the procedure well.  Instrument, sponge, and needle counts were correct times two.  The patient was then taken to the recovery room awake and in stable condition.   Mariel Aloe, MD, FACOG Attending Obstetrician & Gynecologist Faculty Practice, [facility]

## 2021-05-28 NOTE — Anesthesia Preprocedure Evaluation (Signed)
Anesthesia Evaluation  Patient identified by MRN, date of birth, ID band Patient awake    Reviewed: Allergy & Precautions, NPO status , Patient's Chart, lab work & pertinent test results  Airway Mallampati: II  TM Distance: >3 FB     Dental   Pulmonary neg pulmonary ROS,    breath sounds clear to auscultation       Cardiovascular negative cardio ROS   Rhythm:Regular Rate:Normal     Neuro/Psych negative neurological ROS     GI/Hepatic negative GI ROS, Neg liver ROS,   Endo/Other  negative endocrine ROS  Renal/GU negative Renal ROS     Musculoskeletal   Abdominal   Peds  Hematology  (+) anemia ,   Anesthesia Other Findings   Reproductive/Obstetrics                             Lab Results  Component Value Date   WBC 16.3 (H) 05/28/2021   HGB 10.4 (L) 05/28/2021   HCT 30.9 (L) 05/28/2021   MCV 88.3 05/28/2021   PLT 213 05/28/2021   Lab Results  Component Value Date   CREATININE 0.48 (L) 08/05/2011   BUN 6 08/05/2011   NA 135 08/05/2011   K 3.3 (L) 08/05/2011   CL 103 08/05/2011   CO2 21 08/05/2011    Anesthesia Physical Anesthesia Plan  ASA: 2  Anesthesia Plan: Spinal   Post-op Pain Management:    Induction:   PONV Risk Score and Plan: 2 and Dexamethasone, Ondansetron and Treatment may vary due to age or medical condition  Airway Management Planned: Natural Airway and Simple Face Mask  Additional Equipment:   Intra-op Plan:   Post-operative Plan:   Informed Consent: I have reviewed the patients History and Physical, chart, labs and discussed the procedure including the risks, benefits and alternatives for the proposed anesthesia with the patient or authorized representative who has indicated his/her understanding and acceptance.       Plan Discussed with: CRNA  Anesthesia Plan Comments:         Anesthesia Quick Evaluation

## 2021-05-28 NOTE — Transfer of Care (Signed)
Immediate Anesthesia Transfer of Care Note  Patient: [name] [name]  Procedure(s) Performed: POST PARTUM TUBAL LIGATION (Bilateral)  Patient Location: PACU  Anesthesia Type:Spinal  Level of Consciousness: awake  Airway & Oxygen Therapy: Patient Spontanous Breathing  Post-op Assessment: Report given to RN and Post -op Vital signs reviewed and stable  Post vital signs: Reviewed and stable  Last Vitals:  Vitals Value Taken Time  BP 104/70 05/28/21 1630  Temp 36.8 C 05/28/21 1629  Pulse 101 05/28/21 1633  Resp 23 05/28/21 1633  SpO2 100 % 05/28/21 1633  Vitals shown include unvalidated device data.  Last Pain:  Vitals:   05/28/21 1629  TempSrc: Oral  PainSc: 2          Complications: No notable events documented.

## 2021-05-28 NOTE — Progress Notes (Signed)
POSTPARTUM PROGRESS NOTE  Post Partum Day 1  Subjective:  Khaliyah Veltre is a 35 y.o. Q2I2979 s/p SVD at [redacted]w[redacted]d.  She reports mild dizziness on standing and some weakness this morning. Otherwise, she reports she is doing well. No problems with voiding or po intake. Denies nausea or vomiting.  Pain is well controlled.  Lochia is mild, similar to a period, and improving.  Objective: Blood pressure 107/79, pulse 95, temperature 99.7 F (37.6 C), temperature source Oral, resp. rate 16, height 5\' 4"  (1.626 m), weight 88.8 kg, last menstrual period 09/02/2020, SpO2 98 %, unknown if currently breastfeeding.  Physical Exam:  General: alert, cooperative and no distress Chest: no respiratory distress Heart:regular rate, distal pulses intact Abdomen: soft, nontender,  Uterine Fundus: firm, appropriately tender DVT Evaluation: No calf swelling or tenderness Extremities: no edema Skin: warm, dry  Recent Labs    05/27/21 2217 05/28/21 0514  HGB 11.7* 10.4*  HCT 35.3* 30.9*    Assessment/Plan: Kasia Herbers is a 35 y.o. 34 s/p SVD at [redacted]w[redacted]d. With PPH of [redacted]w[redacted]d  PPD#1 - Doing well  Routine postpartum care PPH: HGB 10.4 this morning, down from 11.7. Mildly symptomatic this morning. Vital signs stable. Continue to monitor symptoms. Contraception: BTL at 1500 today Feeding: breast Dispo: Plan for discharge likely tomorrow.   LOS: 1 day   , PGY-1, Faculty Service 05/28/2021, 7:55 AM

## 2021-05-29 ENCOUNTER — Other Ambulatory Visit (HOSPITAL_COMMUNITY): Payer: Self-pay

## 2021-05-29 MED ORDER — OXYCODONE HCL 5 MG PO TABS
5.0000 mg | ORAL_TABLET | Freq: Three times a day (TID) | ORAL | 0 refills | Status: AC | PRN
  Filled 2021-05-29: qty 4, 2d supply, fill #0

## 2021-05-29 MED ORDER — IBUPROFEN 600 MG PO TABS
600.0000 mg | ORAL_TABLET | Freq: Four times a day (QID) | ORAL | 0 refills | Status: AC | PRN
  Filled 2021-05-29: qty 28, 7d supply, fill #0

## 2021-05-29 MED ORDER — ACETAMINOPHEN 325 MG PO TABS
650.0000 mg | ORAL_TABLET | ORAL | 0 refills | Status: AC | PRN
  Filled 2021-05-29: qty 90, 8d supply, fill #0

## 2021-05-29 NOTE — Anesthesia Postprocedure Evaluation (Signed)
Anesthesia Post Note  Patient: [name] [name]  Procedure(s) Performed: POST PARTUM TUBAL LIGATION (Bilateral)     Patient location during evaluation: PACU Anesthesia Type: Spinal Level of consciousness: awake and alert Pain management: pain level controlled Vital Signs Assessment: post-procedure vital signs reviewed and stable Respiratory status: spontaneous breathing and respiratory function stable Cardiovascular status: blood pressure returned to baseline and stable Postop Assessment: spinal receding Anesthetic complications: no   No notable events documented.  Last Vitals:  Vitals:   05/29/21 0045 05/29/21 0500  BP: 109/65 (!) 94/57  Pulse: 70 82  Resp: 17 17  Temp: 36.5 C 36.5 C  SpO2: 100% 98%    Last Pain:  Vitals:   05/29/21 0816  TempSrc:   PainSc: 5                  Kennieth Rad

## 2021-05-29 NOTE — Social Work (Addendum)
CSW received consult for car seat, pack n play and food insecurities. CSW met with MOB to offer support and complete assessment.    Burmese interpreter used.   CSW met with MOB at bedside and introduced role. FOB was present. CSW observed MOB holding the infant. CSW inquired about MOB need for car seat and crib. MOB shared was not expecting the infant to arrive so early and did not have an opportunity to purchase a car seat in time. MOB shared she was given a car seat by the hospital some years however it expired. MOB reported she does not have funds to pay for the car seat. CSW provided MOB with a pack n play and car seat. MOB completed waivers. CSW inquired if MOB has adequate food at home. MOB reported she has food and receives [facility] benefits. MOB provided MOB with community food pantry information for reference. MOB inquired about which formula to purchase for the infant.CSW presented the question to nurse to follow up with MOB.   CSW provided review of Sudden Infant Death Syndrome (SIDS) precautions and the importance of a safe sleep space. MOB reported understanding. MOB has chosen [facility] for infant's follow up. MOB confirmed she has transportation to appointments.   CSW identifies no further need for intervention and no barriers to discharge at this time.   Kathrin Greathouse, MSW, LCSW [facility] [place] Worker  [phone] 09-02-2020  10:11 AM

## 2021-05-29 NOTE — Progress Notes (Signed)
Patient has no complaints. I asked patient if she understood me . I she and dad said they understand fully.

## 2021-05-30 ENCOUNTER — Telehealth: Payer: Self-pay

## 2021-05-30 NOTE — Telephone Encounter (Signed)
Transition Care Management Unsuccessful Follow-up Telephone Call  Date of discharge and from where:  05/29/2021 from [facility]  Attempts:  1st Attempt  Reason for unsuccessful TCM follow-up call:  Left voice message

## 2021-06-01 LAB — SURGICAL PATHOLOGY

## 2021-06-01 NOTE — Telephone Encounter (Signed)
Transition Care Management Unsuccessful Follow-up Telephone Call  Date of discharge and from where:  05/29/2021 from [facility]  Attempts:  2nd Attempt  Reason for unsuccessful TCM follow-up call:  Left voice message

## 2021-06-02 NOTE — Telephone Encounter (Signed)
Transition Care Management Unsuccessful Follow-up Telephone Call  Date of discharge and from where:  05/29/2021-[facility]  Attempts:  3rd Attempt  Reason for unsuccessful TCM follow-up call:  Left voice message

## 2021-06-09 ENCOUNTER — Telehealth (HOSPITAL_COMMUNITY): Payer: Self-pay

## 2021-06-09 NOTE — Telephone Encounter (Signed)
No answer. Left message to return nurse call.  Marcelino Duster [facility] 06/09/2021,1446

## 2021-06-25 ENCOUNTER — Ambulatory Visit: Payer: BC Managed Care – PPO | Admitting: Family Medicine

## 2021-07-09 ENCOUNTER — Other Ambulatory Visit: Payer: Self-pay

## 2021-07-09 ENCOUNTER — Encounter: Payer: Self-pay | Admitting: Student

## 2021-07-09 ENCOUNTER — Ambulatory Visit (INDEPENDENT_AMBULATORY_CARE_PROVIDER_SITE_OTHER): Payer: BC Managed Care – PPO | Admitting: Student

## 2021-07-09 NOTE — Progress Notes (Signed)
Post Partum Visit Note  [name] [name] is a 35 y.o. [id number] female who presents for a postpartum visit. She is 6 weeks postpartum following a normal spontaneous vaginal delivery.  I have fully reviewed the prenatal and intrapartum course. The delivery was at 39 gestational weeks.  Anesthesia: none. Postpartum course has been uneventful. She reports that sometimes she has pain at her umbilicus. Baby is doing well.  Baby is feeding by breast. Bleeding staining only. Bowel function is normal. Bladder function is normal. Patient is not sexually active. Contraception method is tubal ligation. Postpartum depression screening: negative.   The pregnancy intention screening data noted above was reviewed. Potential methods of contraception were discussed. The patient elected to proceed with No data recorded.   Edinburgh Postnatal Depression Scale - 07/09/21 1340       Edinburgh Postnatal Depression Scale:  In the Past 7 Days   I have been able to laugh and see the funny side of things. 0    I have looked forward with enjoyment to things. 0    I have blamed myself unnecessarily when things went wrong. 0    I have been anxious or worried for no good reason. 0    I have felt scared or panicky for no good reason. 0    Things have been getting on top of me. 0    I have been so unhappy that I have had difficulty sleeping. 0    I have felt sad or miserable. 0    I have been so unhappy that I have been crying. 0    The thought of harming myself has occurred to me. 0    Edinburgh Postnatal Depression Scale Total 0             Health Maintenance Due  Topic Date Due   COVID-19 Vaccine (2 - Booster for Janssen series) 12/29/2019    The following portions of the patient's history were reviewed and updated as appropriate: allergies, current medications, past family history, past medical history, past social history, past surgical history, and problem list.  Review of Systems Pertinent items are noted in  HPI.  Objective:  BP 107/71   Pulse 66   Wt 165 lb (74.8 kg)   LMP 09/02/2020   Breastfeeding Yes   BMI 28.32 kg/m    General:  alert, cooperative, and no distress   Breasts:  not indicated  Lungs: normal percussion bilaterally  Heart:  regular rate and rhythm, S1, S2 normal, no murmur, click, rub or gallop  Abdomen: soft, non-tender; bowel sounds normal; no masses,  no organomegaly   Wound NA  GU exam:  not indicated       Assessment:    Healthy postpartum exam.   Plan:   Essential components of care per ACOG recommendations:  1.  Mood and well being: Patient with negative depression screening today. Reviewed local resources for support.  - Patient tobacco use? No.   - hx of drug use? No.    2. Infant care and feeding:  -Patient currently breastmilk feeding? Yes. Reviewed importance of draining breast regularly to support lactation.  -Social determinants of health (SDOH) reviewed in EPIC. No concerns  3. Sexuality, contraception and birth spacing - Patient does not want a pregnancy in the next year.  Desired family size is 4 children.  - Reviewed forms of contraception in tiered fashion. Patient desired bilateral tubal ligation today.   - Discussed birth spacing of 18 months  4.  Sleep and fatigue -Encouraged family/partner/community support of 4 hrs of uninterrupted sleep to help with mood and fatigue  5. Physical Recovery  - Discussed patients delivery and complications. She describes her labor as mixed.  - Patient had a Vaginal problems after delivery including PPH . Patient had a  none  laceration. Perineal healing reviewed. Patient expressed understanding - Patient has urinary incontinence? No. - Patient is safe to resume physical and sexual activity  6.  Health Maintenance - HM due items addressed None - Last pap smear  Diagnosis  Date Value Ref Range Status  05/06/2020   Final   - Negative for intraepithelial lesion or malignancy (NILM)   Pap smear not  done at today's visit.  -Breast Cancer screening indicated? No.   7. Chronic Disease/Pregnancy Condition follow up: None  - PCP follow up  Marylene Land, CNM Center for [facility], [facility]

## 2022-06-28 DIAGNOSIS — H5213 Myopia, bilateral: Secondary | ICD-10-CM | POA: Diagnosis not present
